# Patient Record
Sex: Female | Born: 1935 | Race: White | Hispanic: No | Marital: Married
Health system: Southern US, Community
[De-identification: ages and names within clinical notes are randomized; demographics above are authoritative.]

## PROBLEM LIST (undated history)

## (undated) DIAGNOSIS — M810 Age-related osteoporosis without current pathological fracture: Secondary | ICD-10-CM

## (undated) DIAGNOSIS — F329 Major depressive disorder, single episode, unspecified: Secondary | ICD-10-CM

## (undated) DIAGNOSIS — F32A Depression, unspecified: Secondary | ICD-10-CM

## (undated) DIAGNOSIS — H409 Unspecified glaucoma: Secondary | ICD-10-CM

## (undated) HISTORY — PX: CHOLECYSTECTOMY: SHX55

## (undated) HISTORY — PX: OTHER SURGICAL HISTORY: SHX169

## (undated) HISTORY — DX: Unspecified glaucoma: H40.9

## (undated) HISTORY — DX: Depression, unspecified: F32.A

## (undated) HISTORY — DX: Age-related osteoporosis without current pathological fracture: M81.0

---
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
## 1998-08-30 ENCOUNTER — Other Ambulatory Visit: Admission: RE | Admit: 1998-08-30 | Discharge: 1998-08-30 | Payer: Self-pay | Admitting: Urology

## 2001-09-18 ENCOUNTER — Other Ambulatory Visit: Admission: RE | Admit: 2001-09-18 | Discharge: 2001-09-18 | Payer: Self-pay | Admitting: Nurse Practitioner

## 2001-12-03 ENCOUNTER — Ambulatory Visit (HOSPITAL_COMMUNITY): Admission: RE | Admit: 2001-12-03 | Discharge: 2001-12-03 | Payer: Self-pay | Admitting: Gastroenterology

## 2002-07-31 ENCOUNTER — Encounter: Admission: RE | Admit: 2002-07-31 | Discharge: 2002-07-31 | Payer: Self-pay | Admitting: Family Medicine

## 2002-07-31 ENCOUNTER — Encounter: Payer: Self-pay | Admitting: Family Medicine

## 2002-08-18 ENCOUNTER — Encounter: Admission: RE | Admit: 2002-08-18 | Discharge: 2002-10-01 | Payer: Self-pay | Admitting: *Deleted

## 2017-06-25 DIAGNOSIS — C50919 Malignant neoplasm of unspecified site of unspecified female breast: Secondary | ICD-10-CM

## 2017-06-25 HISTORY — DX: Malignant neoplasm of unspecified site of unspecified female breast: C50.919

## 2018-06-06 HISTORY — PX: BREAST BIOPSY: SHX20

## 2018-06-25 DIAGNOSIS — Z923 Personal history of irradiation: Secondary | ICD-10-CM

## 2018-06-25 HISTORY — DX: Personal history of irradiation: Z92.3

## 2018-06-25 HISTORY — PX: BREAST LUMPECTOMY: SHX2

## 2019-12-17 ENCOUNTER — Other Ambulatory Visit
Admission: RE | Admit: 2019-12-17 | Discharge: 2019-12-17 | Disposition: A | Discharge status: Home / Self Care | Payer: Medicare Other | Source: Ambulatory Visit | Attending: Student | Admitting: Student

## 2019-12-17 DIAGNOSIS — R0602 Shortness of breath: Secondary | ICD-10-CM | POA: Diagnosis present

## 2019-12-17 DIAGNOSIS — R5383 Other fatigue: Secondary | ICD-10-CM | POA: Insufficient documentation

## 2019-12-17 DIAGNOSIS — Z7689 Persons encountering health services in other specified circumstances: Secondary | ICD-10-CM | POA: Insufficient documentation

## 2019-12-17 LAB — BRAIN NATRIURETIC PEPTIDE: B Natriuretic Peptide: 65 pg/mL (ref 0.0–100.0)

## 2019-12-17 LAB — FIBRIN DERIVATIVES D-DIMER (ARMC ONLY): Fibrin derivatives D-dimer (ARMC): 662 ng/mL (FEU) — ABNORMAL HIGH (ref 0.00–499.00)

## 2019-12-23 ENCOUNTER — Other Ambulatory Visit: Payer: Self-pay | Admitting: Ophthalmology

## 2019-12-23 DIAGNOSIS — H4921 Sixth [abducent] nerve palsy, right eye: Secondary | ICD-10-CM

## 2020-01-07 ENCOUNTER — Encounter: Payer: Self-pay | Admitting: Oncology

## 2020-01-07 DIAGNOSIS — F329 Major depressive disorder, single episode, unspecified: Secondary | ICD-10-CM | POA: Insufficient documentation

## 2020-01-07 DIAGNOSIS — F32A Depression, unspecified: Secondary | ICD-10-CM | POA: Insufficient documentation

## 2020-01-07 NOTE — Progress Notes (Signed)
Pt contacted for new patient visit. Pt recently moved from [facility], [place] and is wanting to establish cre with a local heme/onc. Reports she had breast cancer in the past and was treated with radiation. She has tried 2 anti-estrogen medications but says that they made her feel worse. Stopped most recent medication about 1 month ago. Did not specify medication names.

## 2020-01-08 ENCOUNTER — Other Ambulatory Visit: Payer: Self-pay

## 2020-01-08 ENCOUNTER — Inpatient Hospital Stay: Payer: Medicare Other

## 2020-01-08 ENCOUNTER — Telehealth: Payer: Self-pay | Admitting: *Deleted

## 2020-01-08 ENCOUNTER — Inpatient Hospital Stay: Payer: Medicare Other | Attending: Oncology | Admitting: Oncology

## 2020-01-08 ENCOUNTER — Encounter: Payer: Self-pay | Admitting: Oncology

## 2020-01-08 VITALS — BP 153/81 | HR 69 | Temp 97.7°F | Wt 179.9 lb

## 2020-01-08 DIAGNOSIS — Z79811 Long term (current) use of aromatase inhibitors: Secondary | ICD-10-CM | POA: Diagnosis not present

## 2020-01-08 DIAGNOSIS — Z170 Estrogen receptor positive status [ER+]: Secondary | ICD-10-CM | POA: Diagnosis not present

## 2020-01-08 DIAGNOSIS — C50512 Malignant neoplasm of lower-outer quadrant of left female breast: Secondary | ICD-10-CM | POA: Diagnosis not present

## 2020-01-08 DIAGNOSIS — Z853 Personal history of malignant neoplasm of breast: Secondary | ICD-10-CM

## 2020-01-08 DIAGNOSIS — N63 Unspecified lump in unspecified breast: Secondary | ICD-10-CM

## 2020-01-08 NOTE — Progress Notes (Signed)
Hematology/Oncology Consult note [facility] Telephone:([phone] [phone]   Patient Care Team: Gladstone Lighter, MD as PCP - General (Internal Medicine)  REFERRING PROVIDER: Gladstone Lighter, MD  CHIEF COMPLAINTS/REASON FOR VISIT:  Evaluation of breast cancer  HISTORY OF PRESENTING ILLNESS:   Kimberly Hatfield is a  84 y.o.  female with PMH listed below was seen in consultation at the request of  Gladstone Lighter, MD  for evaluation of history of breast cancer.  Patient has a history of left breast cancer.  Her previous cancer care was at [place]. Patient moved to [facility] and wants to establish care with local oncologist. Patient brought past medical records and extensive medical record review was performed by me.  05/08/2018, screening mammogram showed right breast negative, left breast small 6 mm mass 6:00, additional 7 or 8 mm mass at 9:00.  Biopsy showed 5:00 breast mass was positive for invasive mammary carcinoma, ER/PR positive HER-2 negative.  9:00 breast mass biopsy showed intraductal papilloma. 06/27/2018 left partial mastectomy and sentinel lymph node biopsy.  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  Sentinel lymph nodes were negative. Received radiation to the left breast with a dose of 42.56 Gy in 16 fractions followed by boost to the left lumpectomy cavity for an additional 10 Gray in 4 fractions.  Total dose was 52.56 Gy in 20 fractions. 10/10/2018 started on Arimidex. 10/31/2018, Arimidex was held due to severe hot flashes. 11/24/2018, switched to Aromasin.  Patient reports that she has stopped Aromasin treatments about 1 month ago.  She reports feeling that both Arimidex and Aromasin have caused severe fatigue, hot  flash.  Patient has been on Effexor chronically which did not help her vasomotor symptoms. She wants to discuss if there is any other options.  Patient was accompanied by her husband.   Review of Systems  Constitutional: Negative for appetite change, chills, fatigue and fever.  HENT:   Negative for hearing loss and voice change.   Eyes: Negative for eye problems.  Respiratory: Negative for chest tightness and cough.   Cardiovascular: Negative for chest pain.  Gastrointestinal: Negative for abdominal distention, abdominal pain and blood in stool.  Endocrine: Negative for hot flashes.  Genitourinary: Negative for difficulty urinating and frequency.   Musculoskeletal: Negative for arthralgias.  Skin: Negative for itching and rash.  Neurological: Negative for extremity weakness.  Hematological: Negative for adenopathy.  Psychiatric/Behavioral: Negative for confusion.    MEDICAL HISTORY:  Past Medical History:  Diagnosis Date  . Breast cancer ([place]) 2019  . Depression   . Glaucoma   . Osteoporosis     SURGICAL HISTORY: Past Surgical History:  Procedure Laterality Date  . CHOLECYSTECTOMY    . left breast lumpectomy      SOCIAL HISTORY: Social History   Socioeconomic History  . Marital status: Married    Spouse name: Not on file  . Number of children: Not on file  . Years of education: Not on file  . Highest education level: Not on file  Occupational History  . Not on file  Tobacco Use  . Smoking status: Never Smoker  Vaping Use  . Vaping Use: Never used  Substance and Sexual Activity  . Alcohol use: Not Currently    Comment: occasional glass of wine   . Drug use: Never  . Sexual activity:  Not on file  Other Topics Concern  . Not on file  Social History Narrative  . Not on file   Social Determinants of Health   Financial Resource Strain:   . Difficulty of Paying Living Expenses:   Food Insecurity:   . Worried About Charity fundraiser in the Last Year:   .  Arboriculturist in the Last Year:   Transportation Needs:   . Film/video editor (Medical):   Marland Kitchen Lack of Transportation (Non-Medical):   Physical Activity:   . Days of Exercise per Week:   . Minutes of Exercise per Session:   Stress:   . Feeling of Stress :   Social Connections:   . Frequency of Communication with Friends and Family:   . Frequency of Social Gatherings with Friends and Family:   . Attends Religious Services:   . Active Member of Clubs or Organizations:   . Attends Archivist Meetings:   Marland Kitchen Marital Status:   Intimate Partner Violence:   . Fear of Current or Ex-Partner:   . Emotionally Abused:   Marland Kitchen Physically Abused:   . Sexually Abused:     FAMILY HISTORY: Family History  Problem Relation Age of Onset  . Heart disease Mother   . CAD Mother   . Heart disease Father   . CAD Father     ALLERGIES:  has no allergies on file.  MEDICATIONS:  Current Outpatient Medications  Medication Sig Dispense Refill  . brimonidine-timolol (COMBIGAN) 0.2-0.5 % ophthalmic solution 1 drop 2 (two) times daily    . esomeprazole (NEXIUM) 20 MG capsule Take by mouth.    . Travoprost, BAK Free, (TRAVATAN) 0.004 % SOLN ophthalmic solution 1 drop nightly    . traZODone (DESYREL) 100 MG tablet Take by mouth.    . venlafaxine XR (EFFEXOR-XR) 150 MG 24 hr capsule Take by mouth.     No current facility-administered medications for this visit.     PHYSICAL EXAMINATION: ECOG PERFORMANCE STATUS: 0 - Asymptomatic Vitals:   01/08/20 1500  BP: (!) 153/81  Pulse: 69  Temp: 97.7 F (36.5 C)   Filed Weights   01/08/20 1500  Weight: 179 lb 14.4 oz (81.6 kg)    Physical Exam Constitutional:      General: She is not in acute distress. HENT:     Head: Normocephalic and atraumatic.  Eyes:     General: No scleral icterus. Cardiovascular:     Rate and Rhythm: Normal rate and regular rhythm.     Heart sounds: Normal heart sounds.  Pulmonary:     Effort: Pulmonary effort  is normal. No respiratory distress.     Breath sounds: No wheezing.  Abdominal:     General: Bowel sounds are normal. There is no distension.     Palpations: Abdomen is soft.  Musculoskeletal:        General: No deformity. Normal range of motion.     Cervical back: Normal range of motion and neck supple.  Skin:    General: Skin is warm and dry.     Findings: No erythema or rash.  Neurological:     Mental Status: She is alert and oriented to person, place, and time. Mental status is at baseline.     Cranial Nerves: No cranial nerve deficit.     Coordination: Coordination normal.  Psychiatric:        Mood and Affect: Mood normal.   Breast exam was performed in seated and lying down  position. Patient is status post left lumpectomy with a well-healed surgical scar.  Palpable mass at the site of lumpectomy, no palpable mass in the right breast.  No palpable lymphadenopathy bilateral axilla    LABORATORY DATA:  I have reviewed the data as listed No results found for: WBC, HGB, HCT, MCV, PLT No results for input(s): NA, K, CL, CO2, GLUCOSE, BUN, CREATININE, CALCIUM, GFRNONAA, GFRAA, PROT, ALBUMIN, AST, ALT, ALKPHOS, BILITOT, BILIDIR, IBILI in the last 8760 hours. Iron/TIBC/Ferritin/ %Sat No results found for: IRON, TIBC, FERRITIN, IRONPCTSAT    RADIOGRAPHIC STUDIES: I have personally reviewed the radiological images as listed and agreed with the findings in the report. No results found.    ASSESSMENT & PLAN:  1. Breast mass   2. History of breast cancer    #History of left stage I breast cancer,pT1c pN0, ER/PR positive, HER-2 negative.  Lumpectomy in 2020 January, status post adjuvant radiation. Patient has tried 2 different aromatase inhibitors and was not able to tolerate due to severe fatigue and vasomotor symptoms. 5-year disease free survival rates are about 98-100%, discussed about rationale of aromatase inhibitor, tamoxifen. Potential side effects of aromatase inhibitor  and tamoxifen will discussed with patient. Patient is not enthusiastic about restarting adjuvant endocrine therapy due to her age, poor tolerability.  Palpable left breast mass at the lumpectomy site, possible scarring tissue.  Patient was not able to tell whether this mass is new or chronic.  She did not previously be attention to that. I will obtain a diagnostic left mammogram for further evaluation.  Orders Placed This Encounter  Procedures  . MM DIAG BREAST TOMO UNI LEFT    Standing Status:   Future    Standing Expiration Date:   01/07/2021    Order Specific Question:   Reason for Exam (SYMPTOM  OR DIAGNOSIS REQUIRED)    Answer:   palpable left breast mass with history of left breast cancer    Order Specific Question:   Preferred imaging location?    Answer:   [facility]  . US Breast Limited Uni Left Inc Axilla    Standing Status:   Future    Standing Expiration Date:   01/07/2021    Order Specific Question:   Reason for Exam (SYMPTOM  OR DIAGNOSIS REQUIRED)    Answer:   palpable left breast mass with history of left breast cancer    Order Specific Question:   Preferred imaging location?    Answer:   [facility]    All questions were answered. The patient knows to call the clinic with any problems questions or concerns.  cc Gladstone Lighter, MD    Return of visit: 6 months Thank you for this kind referral and the opportunity to participate in the care of this patient. A copy of today's note is routed to referring provider    Earlie Server, MD, PhD Hematology Oncology [facility] Pager- [phone] 01/08/2020

## 2020-01-08 NOTE — Telephone Encounter (Signed)
Per 01/08/20 los. Mammogram (last one at another facility)6 months lab/MD   Pt was made aware that she would have to go to [facility] to sign a release form before her mammogram could be scheduled.  All other appts per 01/08/20 los were scheduled as requested.

## 2020-01-22 ENCOUNTER — Other Ambulatory Visit: Payer: Self-pay | Admitting: Student

## 2020-01-22 DIAGNOSIS — R7989 Other specified abnormal findings of blood chemistry: Secondary | ICD-10-CM

## 2020-01-26 ENCOUNTER — Ambulatory Visit
Admission: RE | Admit: 2020-01-26 | Discharge: 2020-01-26 | Disposition: A | Discharge status: Home / Self Care | Payer: Medicare Other | Source: Ambulatory Visit | Attending: Student | Admitting: Student

## 2020-01-26 ENCOUNTER — Other Ambulatory Visit: Payer: Self-pay

## 2020-01-26 DIAGNOSIS — R7989 Other specified abnormal findings of blood chemistry: Secondary | ICD-10-CM

## 2020-01-26 LAB — POCT I-STAT CREATININE: Creatinine, Ser: 0.9 mg/dL (ref 0.44–1.00)

## 2020-01-26 MED ORDER — IOHEXOL 350 MG/ML SOLN
75.0000 mL | Freq: Once | INTRAVENOUS | Status: AC | PRN
  Administered 2020-01-26: 75 mL via INTRAVENOUS

## 2020-01-27 ENCOUNTER — Other Ambulatory Visit: Payer: Self-pay

## 2020-01-28 ENCOUNTER — Ambulatory Visit
Admission: RE | Admit: 2020-01-28 | Discharge: 2020-01-28 | Disposition: A | Discharge status: Home / Self Care | Payer: Medicare Other | Source: Ambulatory Visit | Attending: Ophthalmology | Admitting: Ophthalmology

## 2020-01-28 DIAGNOSIS — H4921 Sixth [abducent] nerve palsy, right eye: Secondary | ICD-10-CM

## 2020-01-28 MED ORDER — GADOBENATE DIMEGLUMINE 529 MG/ML IV SOLN
17.0000 mL | Freq: Once | INTRAVENOUS | Status: AC | PRN
  Administered 2020-01-28: 17 mL via INTRAVENOUS

## 2020-02-08 ENCOUNTER — Other Ambulatory Visit: Payer: Self-pay

## 2020-02-08 ENCOUNTER — Ambulatory Visit
Admission: RE | Admit: 2020-02-08 | Discharge: 2020-02-08 | Disposition: A | Discharge status: Home / Self Care | Payer: Medicare Other | Source: Ambulatory Visit | Attending: Oncology | Admitting: Oncology

## 2020-02-08 DIAGNOSIS — N6320 Unspecified lump in the left breast, unspecified quadrant: Secondary | ICD-10-CM | POA: Diagnosis not present

## 2020-02-08 DIAGNOSIS — N63 Unspecified lump in unspecified breast: Secondary | ICD-10-CM | POA: Diagnosis present

## 2020-02-08 DIAGNOSIS — Z853 Personal history of malignant neoplasm of breast: Secondary | ICD-10-CM | POA: Insufficient documentation

## 2020-02-09 ENCOUNTER — Telehealth: Payer: Self-pay

## 2020-02-09 NOTE — Telephone Encounter (Signed)
-----   Message from Earlie Server, MD sent at 02/09/2020  9:08 AM EDT ----- Mammogram result is good. Please let her know thanks.

## 2020-02-09 NOTE — Telephone Encounter (Signed)
Patient informed. 

## 2020-04-26 ENCOUNTER — Telehealth: Payer: Self-pay | Admitting: *Deleted

## 2020-04-26 DIAGNOSIS — Z853 Personal history of malignant neoplasm of breast: Secondary | ICD-10-CM

## 2020-04-26 NOTE — Telephone Encounter (Signed)
Orders entered.  Kimberly Hatfield, please schedule and inform patient of appt detials.

## 2020-04-26 NOTE — Telephone Encounter (Signed)
Patient is due for her routine yearly mammogram this month and it is not ordered. She had a mammogram of left breast in August, but now needs full mammogram

## 2020-04-26 NOTE — Telephone Encounter (Signed)
Team, please obtain Bilateral diagnostic mammogram in November of 2021

## 2020-04-27 NOTE — Telephone Encounter (Signed)
Done.. Pt mammogram has been scheduled as requested. Pt is aware of the sched 11/19  10:20 @ [place] NEW appt reminder letter will be mailed out also

## 2020-05-13 ENCOUNTER — Ambulatory Visit
Admission: RE | Admit: 2020-05-13 | Discharge: 2020-05-13 | Disposition: A | Discharge status: Home / Self Care | Payer: Medicare Other | Source: Ambulatory Visit | Attending: Oncology | Admitting: Oncology

## 2020-05-13 ENCOUNTER — Other Ambulatory Visit: Payer: Self-pay

## 2020-05-13 DIAGNOSIS — Z853 Personal history of malignant neoplasm of breast: Secondary | ICD-10-CM

## 2020-07-08 ENCOUNTER — Telehealth: Payer: Self-pay | Admitting: Oncology

## 2020-07-08 NOTE — Telephone Encounter (Signed)
Pt aware.

## 2020-07-08 NOTE — Telephone Encounter (Signed)
07/08/2020 Left VM informing pt appts have been moved out per Dr. Tasia Catchings due to inclement weather srw

## 2020-07-11 ENCOUNTER — Inpatient Hospital Stay: Payer: Medicare Other

## 2020-07-11 ENCOUNTER — Inpatient Hospital Stay: Payer: Medicare Other | Admitting: Oncology

## 2020-07-26 ENCOUNTER — Telehealth: Payer: Self-pay | Admitting: Oncology

## 2020-07-26 NOTE — Telephone Encounter (Signed)
Pt called to reschedule her appts scheduled on 2/3.  Her husband is critically ill and she does not want to leave his side.  Rescheduled for two weeks out.

## 2020-07-28 ENCOUNTER — Inpatient Hospital Stay: Payer: Medicare Other

## 2020-07-28 ENCOUNTER — Inpatient Hospital Stay: Payer: Medicare Other | Admitting: Oncology

## 2020-08-09 ENCOUNTER — Other Ambulatory Visit: Payer: Self-pay

## 2020-08-09 DIAGNOSIS — Z853 Personal history of malignant neoplasm of breast: Secondary | ICD-10-CM

## 2020-08-10 ENCOUNTER — Encounter: Payer: Self-pay | Admitting: Oncology

## 2020-08-10 ENCOUNTER — Inpatient Hospital Stay: Payer: Medicare Other | Attending: Oncology

## 2020-08-10 ENCOUNTER — Inpatient Hospital Stay (HOSPITAL_BASED_OUTPATIENT_CLINIC_OR_DEPARTMENT_OTHER): Payer: Medicare Other | Admitting: Oncology

## 2020-08-10 VITALS — BP 134/84 | HR 66 | Temp 98.1°F | Wt 178.3 lb

## 2020-08-10 DIAGNOSIS — M810 Age-related osteoporosis without current pathological fracture: Secondary | ICD-10-CM | POA: Insufficient documentation

## 2020-08-10 DIAGNOSIS — Z170 Estrogen receptor positive status [ER+]: Secondary | ICD-10-CM | POA: Diagnosis not present

## 2020-08-10 DIAGNOSIS — R232 Flushing: Secondary | ICD-10-CM | POA: Insufficient documentation

## 2020-08-10 DIAGNOSIS — Z853 Personal history of malignant neoplasm of breast: Secondary | ICD-10-CM

## 2020-08-10 DIAGNOSIS — Z9889 Other specified postprocedural states: Secondary | ICD-10-CM | POA: Diagnosis not present

## 2020-08-10 DIAGNOSIS — Z923 Personal history of irradiation: Secondary | ICD-10-CM | POA: Diagnosis not present

## 2020-08-10 DIAGNOSIS — Z79811 Long term (current) use of aromatase inhibitors: Secondary | ICD-10-CM | POA: Diagnosis not present

## 2020-08-10 DIAGNOSIS — Z79899 Other long term (current) drug therapy: Secondary | ICD-10-CM | POA: Diagnosis not present

## 2020-08-10 DIAGNOSIS — F32A Depression, unspecified: Secondary | ICD-10-CM | POA: Diagnosis not present

## 2020-08-10 DIAGNOSIS — Z634 Disappearance and death of family member: Secondary | ICD-10-CM | POA: Insufficient documentation

## 2020-08-10 DIAGNOSIS — F4321 Adjustment disorder with depressed mood: Secondary | ICD-10-CM | POA: Diagnosis not present

## 2020-08-10 DIAGNOSIS — R5383 Other fatigue: Secondary | ICD-10-CM | POA: Diagnosis not present

## 2020-08-10 DIAGNOSIS — C50812 Malignant neoplasm of overlapping sites of left female breast: Secondary | ICD-10-CM | POA: Diagnosis present

## 2020-08-10 LAB — CBC WITH DIFFERENTIAL/PLATELET
Abs Immature Granulocytes: 0.01 10*3/uL (ref 0.00–0.07)
Basophils Absolute: 0 10*3/uL (ref 0.0–0.1)
Basophils Relative: 1 %
Eosinophils Absolute: 0.3 10*3/uL (ref 0.0–0.5)
Eosinophils Relative: 5 %
HCT: 37.1 % (ref 36.0–46.0)
Hemoglobin: 12.7 g/dL (ref 12.0–15.0)
Immature Granulocytes: 0 %
Lymphocytes Relative: 37 %
Lymphs Abs: 2.3 10*3/uL (ref 0.7–4.0)
MCH: 31.7 pg (ref 26.0–34.0)
MCHC: 34.2 g/dL (ref 30.0–36.0)
MCV: 92.5 fL (ref 80.0–100.0)
Monocytes Absolute: 0.6 10*3/uL (ref 0.1–1.0)
Monocytes Relative: 10 %
Neutro Abs: 2.9 10*3/uL (ref 1.7–7.7)
Neutrophils Relative %: 47 %
Platelets: 320 10*3/uL (ref 150–400)
RBC: 4.01 MIL/uL (ref 3.87–5.11)
RDW: 13.5 % (ref 11.5–15.5)
WBC: 6.1 10*3/uL (ref 4.0–10.5)
nRBC: 0 % (ref 0.0–0.2)

## 2020-08-10 LAB — COMPREHENSIVE METABOLIC PANEL
ALT: 21 U/L (ref 0–44)
AST: 24 U/L (ref 15–41)
Albumin: 3.7 g/dL (ref 3.5–5.0)
Alkaline Phosphatase: 55 U/L (ref 38–126)
Anion gap: 9 (ref 5–15)
BUN: 17 mg/dL (ref 8–23)
CO2: 27 mmol/L (ref 22–32)
Calcium: 9 mg/dL (ref 8.9–10.3)
Chloride: 100 mmol/L (ref 98–111)
Creatinine, Ser: 0.77 mg/dL (ref 0.44–1.00)
GFR, Estimated: >60 mL/min (ref >60)
Glucose, Bld: 110 mg/dL — ABNORMAL HIGH (ref 70–99)
Potassium: 4.5 mmol/L (ref 3.5–5.1)
Sodium: 136 mmol/L (ref 135–145)
Total Bilirubin: 0.6 mg/dL (ref 0.3–1.2)
Total Protein: 7.4 g/dL (ref 6.5–8.1)

## 2020-08-10 NOTE — Progress Notes (Signed)
Patient here for oncology follow-up appointment, expresses  concerns of lump on left side

## 2020-08-10 NOTE — Progress Notes (Signed)
Hematology/Oncology Consult note [facility] [phone] [phone]   Patient Care Team: Gladstone Lighter, MD as PCP - General (Internal Medicine)  REFERRING PROVIDER: Gladstone Lighter, MD  CHIEF COMPLAINTS/REASON FOR VISIT:  Evaluation of breast cancer  HISTORY OF PRESENTING ILLNESS:   Kimberly Hatfield is a  85 y.o.  female with PMH listed below was seen in consultation at the request of  Gladstone Lighter, MD  for evaluation of history of breast cancer.  Patient has a history of left breast cancer.  Her previous cancer care was at [place]. Patient moved to [facility] and wants to establish care with local oncologist. Patient brought past medical records and extensive medical record review was performed by me.  05/08/2018, screening mammogram showed right breast negative, left breast small 6 mm mass 6:00, additional 7 or 8 mm mass at 9:00.  Biopsy showed 5:00 breast mass was positive for invasive mammary carcinoma, ER/PR positive HER-2 negative.  9:00 breast mass biopsy showed intraductal papilloma. 06/27/2018 left partial mastectomy and sentinel lymph node biopsy.  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  Sentinel lymph nodes were negative. Received radiation to the left breast with a dose of 42.56 Gy in 16 fractions followed by boost to the left lumpectomy cavity for an additional 10 Gray in 4 fractions.  Total dose was 52.56 Gy in 20 fractions. 10/10/2018 started on Arimidex. 10/31/2018, Arimidex was held due to severe hot flashes. 11/24/2018, switched to Aromasin.  Patient reports that she has stopped Aromasin treatments about 1 month ago.  She reports feeling that both Arimidex and Aromasin have caused severe fatigue, hot  flash.  Patient has been on Effexor chronically which did not help her vasomotor symptoms. She wants to discuss if there is any other options.  Patient was accompanied by her husband.  INTERVAL HISTORY Kimberly Hatfield is a 85 y.o. female who has above history reviewed by me today presents for follow up visit for history of breast cancer. Problems and complaints are listed below: Husband passed away recently.  Patient is grieving. She continues to worry about the left breast palpable mass around the previous lumpectomy scar. Patient has had diagnostic mammogram done after last visit which showed no evidence of recurrence.  Review of Systems  Constitutional: Negative for appetite change, chills, fatigue and fever.  HENT:   Negative for hearing loss and voice change.   Eyes: Negative for eye problems.  Respiratory: Negative for chest tightness and cough.   Cardiovascular: Negative for chest pain.  Gastrointestinal: Negative for abdominal distention, abdominal pain and blood in stool.  Endocrine: Negative for hot flashes.  Genitourinary: Negative for difficulty urinating and frequency.   Musculoskeletal: Negative for arthralgias.  Skin: Negative for itching and rash.  Neurological: Negative for extremity weakness.  Hematological: Negative for adenopathy.  Psychiatric/Behavioral: Negative for confusion.       She feels sad    MEDICAL HISTORY:  Past Medical History:  Diagnosis Date  . Breast cancer ([place]) 2019   left breast  . Depression   . Glaucoma   . Osteoporosis   . Personal history of radiation therapy 2020   left breast ca     SURGICAL HISTORY: Past Surgical History:  Procedure Laterality Date  . BREAST BIOPSY Left 06/06/2018   6:00 IMC  . BREAST BIOPSY Left 06/06/2018  9:00 intarductal papilloma  . BREAST LUMPECTOMY Left 06/2018   mucinous carcinoma grade 2 clear margins, negative LN  . CHOLECYSTECTOMY    . left breast lumpectomy      SOCIAL HISTORY: Social  History   Socioeconomic History  . Marital status: Married    Spouse name: Not on file  . Number of children: Not on file  . Years of education: Not on file  . Highest education level: Not on file  Occupational History  . Not on file  Tobacco Use  . Smoking status: Never Smoker  . Smokeless tobacco: Never Used  Vaping Use  . Vaping Use: Never used  Substance and Sexual Activity  . Alcohol use: Not Currently    Comment: occasional glass of wine   . Drug use: Never  . Sexual activity: Not on file  Other Topics Concern  . Not on file  Social History Narrative  . Not on file   Social Determinants of Health   Financial Resource Strain: Not on file  Food Insecurity: Not on file  Transportation Needs: Not on file  Physical Activity: Not on file  Stress: Not on file  Social Connections: Not on file  Intimate Partner Violence: Not on file    FAMILY HISTORY: Family History  Problem Relation Age of Onset  . Heart disease Mother   . CAD Mother   . Heart disease Father   . CAD Father     ALLERGIES:  has no allergies on file.  MEDICATIONS:  Current Outpatient Medications  Medication Sig Dispense Refill  . brimonidine-timolol (COMBIGAN) 0.2-0.5 % ophthalmic solution 1 drop 2 (two) times daily    . esomeprazole (NEXIUM) 20 MG capsule Take by mouth.    . traZODone (DESYREL) 100 MG tablet Take by mouth.    . venlafaxine XR (EFFEXOR-XR) 150 MG 24 hr capsule Take by mouth.    . Travoprost, BAK Free, (TRAVATAN) 0.004 % SOLN ophthalmic solution 1 drop nightly (Patient not taking: Reported on 08/10/2020)     No current facility-administered medications for this visit.     PHYSICAL EXAMINATION: ECOG PERFORMANCE STATUS: 0 - Asymptomatic Vitals:   08/10/20 1413  BP: 134/84  Pulse: 66  Temp: 98.1 F (36.7 C)  SpO2: 100%   Filed Weights   08/10/20 1413  Weight: 178 lb 5 oz (80.9 kg)    Physical Exam Constitutional:      General: She is not in acute distress. HENT:      Head: Normocephalic and atraumatic.  Eyes:     General: No scleral icterus. Cardiovascular:     Rate and Rhythm: Normal rate and regular rhythm.     Heart sounds: Normal heart sounds.  Pulmonary:     Effort: Pulmonary effort is normal. No respiratory distress.     Breath sounds: No wheezing.  Abdominal:     General: Bowel sounds are normal. There is no distension.     Palpations: Abdomen is soft.  Musculoskeletal:        General: No deformity. Normal range of motion.     Cervical back: Normal range of motion and neck supple.  Skin:    General: Skin is warm and dry.     Findings: No erythema or rash.  Neurological:     Mental Status: She is alert and oriented to person, place, and time. Mental status is at baseline.     Cranial Nerves: No cranial nerve deficit.     Coordination: Coordination normal.  Psychiatric:  Mood and Affect: Mood normal.   Breast exam was performed in seated and lying down position. Patient is status post left lumpectomy with a well-healed surgical scar.  Palpable mass at the site of lumpectomy, no palpable mass in the right breast.  No palpable lymphadenopathy bilateral axilla    LABORATORY DATA:  I have reviewed the data as listed Lab Results  Component Value Date   WBC 6.1 08/10/2020   HGB 12.7 08/10/2020   HCT 37.1 08/10/2020   MCV 92.5 08/10/2020   PLT 320 08/10/2020   Recent Labs    01/26/20 1342 08/10/20 1345  NA  --  136  K  --  4.5  CL  --  100  CO2  --  27  GLUCOSE  --  110*  BUN  --  17  CREATININE 0.90 0.77  CALCIUM  --  9.0  GFRNONAA  --  >60  PROT  --  7.4  ALBUMIN  --  3.7  AST  --  24  ALT  --  21  ALKPHOS  --  55  BILITOT  --  0.6   Iron/TIBC/Ferritin/ %Sat No results found for: IRON, TIBC, FERRITIN, IRONPCTSAT    RADIOGRAPHIC STUDIES: I have personally reviewed the radiological images as listed and agreed with the findings in the report. No results found.    ASSESSMENT & PLAN:  1. History of breast  cancer   2. Hx of lumpectomy   3. Grief     #History of left stage I breast cancer,pT1c pN0, ER/PR positive, HER-2 negative.  Lumpectomy in 2020 January, status post adjuvant radiation. Currently she is not on antiestrogen treatments due to poor tolerance to two different aromatase inhibitors in the past. 05/13/2020 bilateral left diagnostic mammogram showed no evidence of breast malignancy. Palpable lump at the lumpectomy site is likely scar tissue. I will send patient to establish care with Dr. Peyton Najjar.  Grief, emotional support was provided.  Orders Placed This Encounter  Procedures  . CBC with Differential/Platelet    Standing Status:   Future    Standing Expiration Date:   08/10/2021  . Comprehensive metabolic panel    Standing Status:   Future    Standing Expiration Date:   08/10/2021  . Ambulatory referral to General Surgery    Referral Priority:   Routine    Referral Type:   Surgical    Referral Reason:   Specialty Services Required    Referred to Provider:   Herbert Pun, MD    Requested Specialty:   General Surgery    Number of Visits Requested:   1    All questions were answered. The patient knows to call the clinic with any problems questions or concerns.  cc Gladstone Lighter, MD    Return of visit: 6 months   Earlie Server, MD, PhD Hematology Oncology [facility] Pager- [phone] 08/10/2020

## 2020-08-29 ENCOUNTER — Other Ambulatory Visit: Payer: Self-pay | Admitting: Specialist

## 2020-08-29 DIAGNOSIS — Z853 Personal history of malignant neoplasm of breast: Secondary | ICD-10-CM

## 2020-08-29 DIAGNOSIS — R911 Solitary pulmonary nodule: Secondary | ICD-10-CM

## 2020-09-13 ENCOUNTER — Other Ambulatory Visit: Payer: Self-pay

## 2020-09-13 ENCOUNTER — Ambulatory Visit
Admission: RE | Admit: 2020-09-13 | Discharge: 2020-09-13 | Disposition: A | Discharge status: Home / Self Care | Payer: Medicare Other | Source: Ambulatory Visit | Attending: Specialist | Admitting: Specialist

## 2020-09-13 DIAGNOSIS — R911 Solitary pulmonary nodule: Secondary | ICD-10-CM | POA: Diagnosis present

## 2020-09-13 DIAGNOSIS — Z853 Personal history of malignant neoplasm of breast: Secondary | ICD-10-CM | POA: Insufficient documentation

## 2021-01-22 IMAGING — MG MM DIGITAL DIAGNOSTIC UNILAT*L* W/ TOMO W/ CAD
6 of 10 series · 6 of 30 positions shown · non-contrast
Comparison: Previous exam(s).

CLINICAL DATA: 84-year-old female with history of left breast
lumpectomy in 4958. She presents for evaluation of a palpable lump
in the left breast.

EXAM:
DIGITAL DIAGNOSTIC UNILATERAL LEFT MAMMOGRAM WITH TOMO AND CAD;
ULTRASOUND LEFT BREAST LIMITED

[L CC synth-2D (1 of 2)]
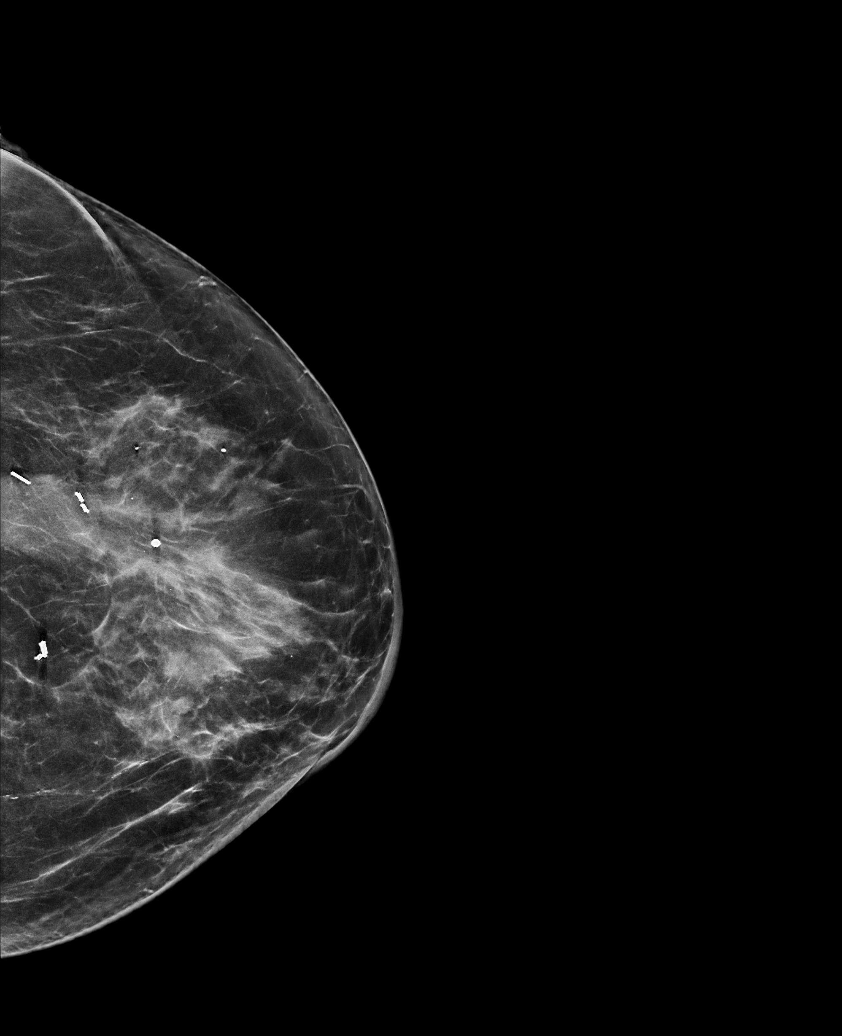

[L TAN synth-2D (1 of 2)]
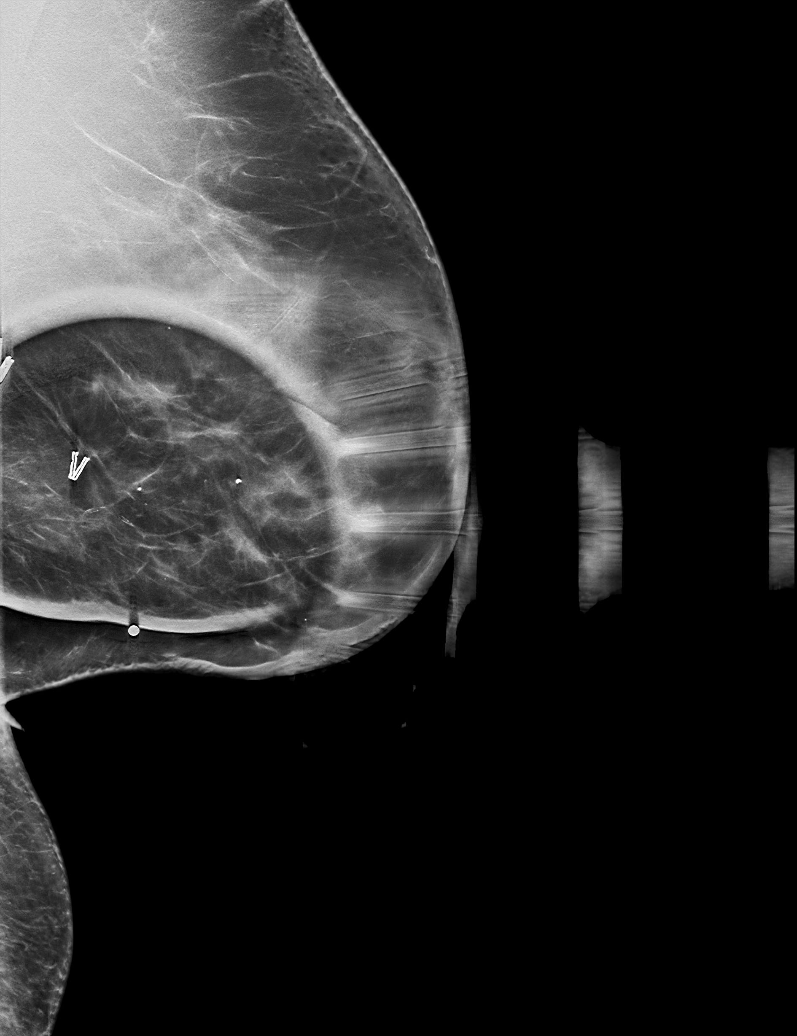

[L TAN synth-2D (2 of 2)]
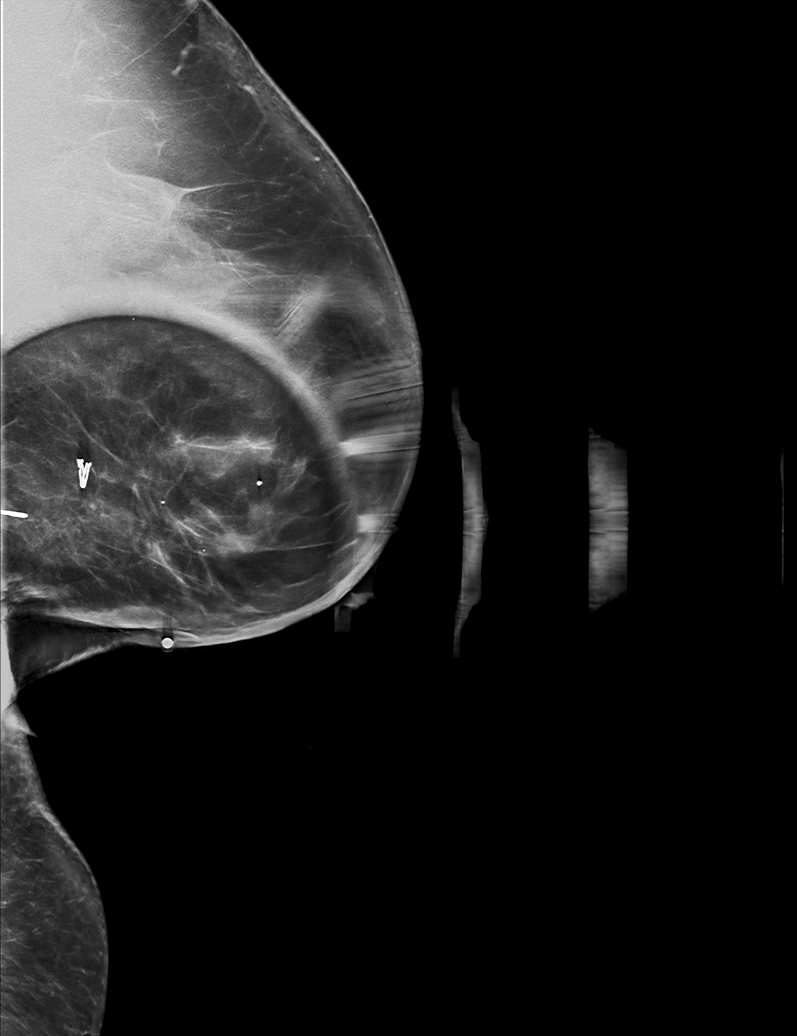

[L CC synth-2D (2 of 2)]
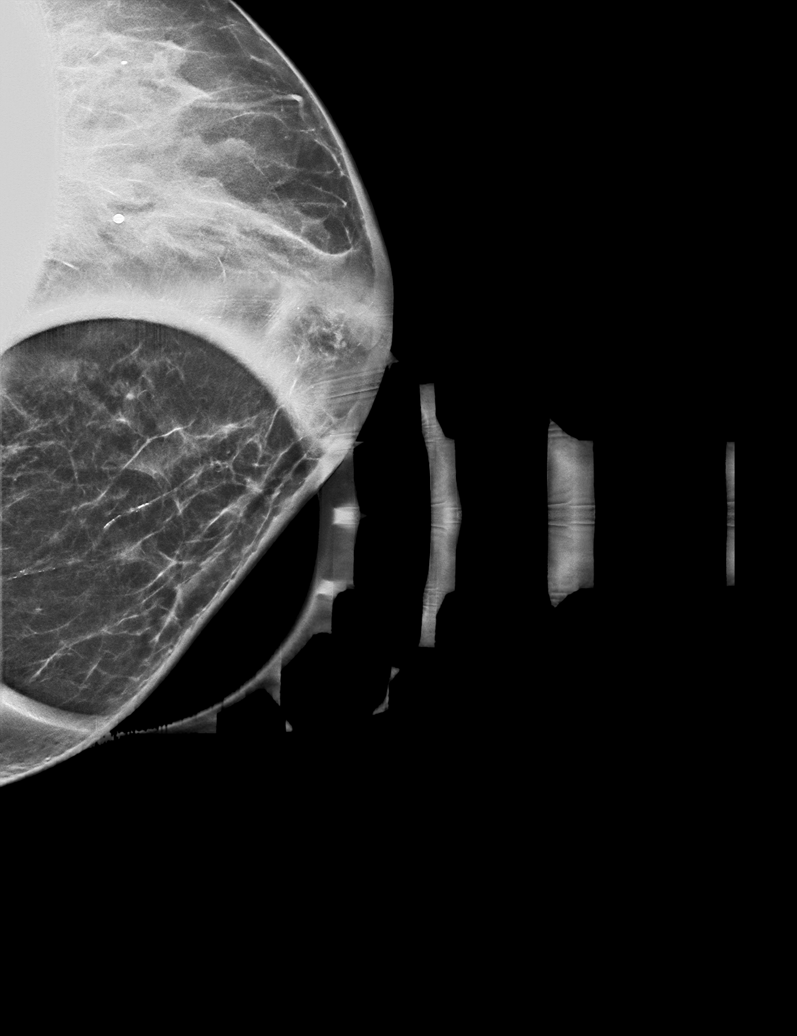

[L MLO synth-2D]
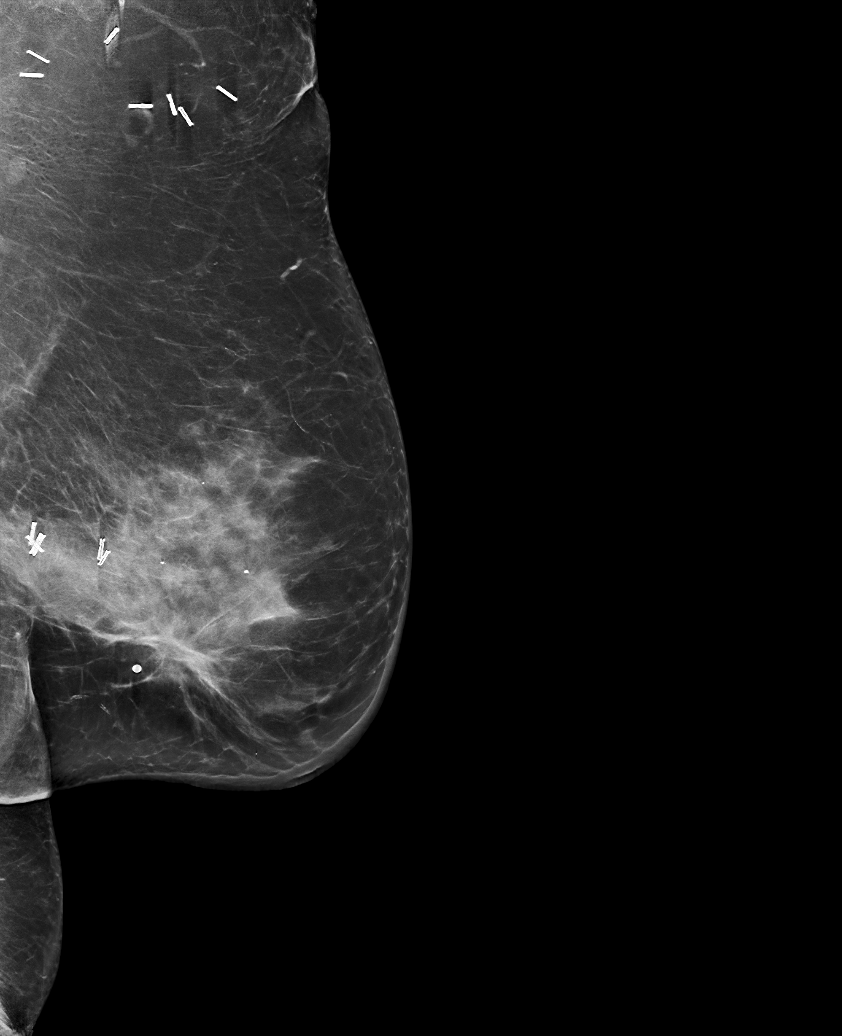

[L TAN tomo · tomo slice 38/75.0]
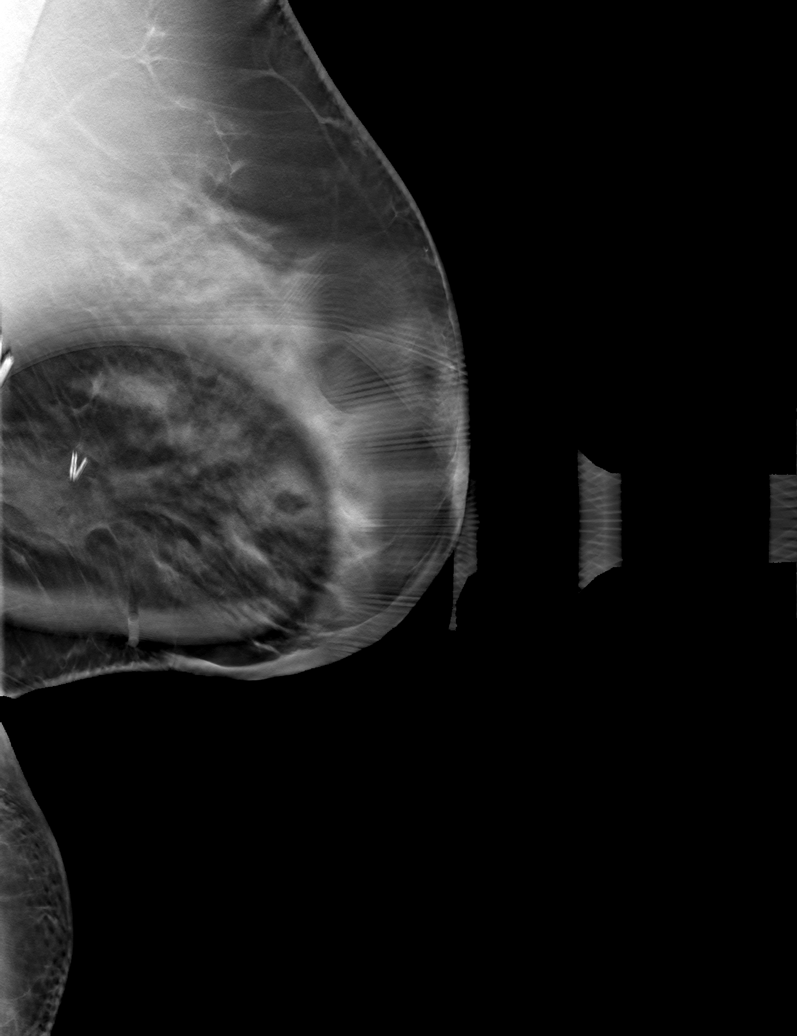

[6 of 30 positions shown; findings below may reference images not displayed]

ACR Breast Density Category c: The breast tissue is heterogeneously
dense, which may obscure small masses.
FINDINGS: A BB has been placed on the inferior aspect of the left breast at
the palpable site of concern. Deep to this marker there is a
surgical distortion just anterior to a broad seroma, which measures
close to 3 cm. The appearance does not appear significantly
different from the prior mammogram. The left breast lumpectomy site
is stable.

An asymmetry seen in the medial middle depth of the left breast on
the initial CC view resolves on spot compression tomosynthesis
imaging consistent with overlapping fibroglandular tissue.

No suspicious calcifications, masses or areas of distortion are seen
in the bilateral breasts.

Mammographic images were processed with CAD.

Physical exam of the inferior left breast demonstrates a superficial
oval mass.

Ultrasound targeted to the palpable site in the left breast at 5
o'clock, 4 cm from the nipple demonstrates a mildly complicated
fluid collection measuring 2.9 x 1.6 x 2.7 cm.

Ultrasound of the medial aspect of the left breast demonstrates
surgical scar and fat necrosis consistent with surgical change. No
suspicious masses are identified to correspond with the asymmetry
identified on the left CC mammographic image.
IMPRESSION: 1. The palpable site in the inferior left breast corresponds with
the patient's surgical seroma.

2.  No evidence of malignancy in the left breast.

RECOMMENDATION:
Bilateral diagnostic mammogram is recommended in Saturday April, 2020.

I have discussed the findings and recommendations with the patient.
If applicable, a reminder letter will be sent to the patient
regarding the next appointment.

BI-RADS CATEGORY  2: Benign.

## 2021-02-06 ENCOUNTER — Other Ambulatory Visit: Payer: Self-pay

## 2021-02-06 ENCOUNTER — Encounter: Payer: Self-pay | Admitting: Oncology

## 2021-02-06 ENCOUNTER — Inpatient Hospital Stay (HOSPITAL_BASED_OUTPATIENT_CLINIC_OR_DEPARTMENT_OTHER): Payer: Medicare Other | Admitting: Oncology

## 2021-02-06 ENCOUNTER — Inpatient Hospital Stay: Payer: Medicare Other | Attending: Oncology

## 2021-02-06 VITALS — BP 117/53 | HR 68 | Temp 96.7°F | Resp 18 | Wt 174.0 lb

## 2021-02-06 DIAGNOSIS — Z853 Personal history of malignant neoplasm of breast: Secondary | ICD-10-CM

## 2021-02-06 DIAGNOSIS — Z79811 Long term (current) use of aromatase inhibitors: Secondary | ICD-10-CM | POA: Diagnosis not present

## 2021-02-06 DIAGNOSIS — Z170 Estrogen receptor positive status [ER+]: Secondary | ICD-10-CM | POA: Diagnosis not present

## 2021-02-06 DIAGNOSIS — C50812 Malignant neoplasm of overlapping sites of left female breast: Secondary | ICD-10-CM | POA: Insufficient documentation

## 2021-02-06 DIAGNOSIS — Z1231 Encounter for screening mammogram for malignant neoplasm of breast: Secondary | ICD-10-CM | POA: Diagnosis not present

## 2021-02-06 LAB — CBC WITH DIFFERENTIAL/PLATELET
Abs Immature Granulocytes: 0.01 10*3/uL (ref 0.00–0.07)
Basophils Absolute: 0 10*3/uL (ref 0.0–0.1)
Basophils Relative: 0 %
Eosinophils Absolute: 0.2 10*3/uL (ref 0.0–0.5)
Eosinophils Relative: 4 %
HCT: 39.3 % (ref 36.0–46.0)
Hemoglobin: 13 g/dL (ref 12.0–15.0)
Immature Granulocytes: 0 %
Lymphocytes Relative: 35 %
Lymphs Abs: 1.9 10*3/uL (ref 0.7–4.0)
MCH: 31.4 pg (ref 26.0–34.0)
MCHC: 33.1 g/dL (ref 30.0–36.0)
MCV: 94.9 fL (ref 80.0–100.0)
Monocytes Absolute: 0.3 10*3/uL (ref 0.1–1.0)
Monocytes Relative: 6 %
Neutro Abs: 3 10*3/uL (ref 1.7–7.7)
Neutrophils Relative %: 55 %
Platelets: 318 10*3/uL (ref 150–400)
RBC: 4.14 MIL/uL (ref 3.87–5.11)
RDW: 13.1 % (ref 11.5–15.5)
WBC: 5.5 10*3/uL (ref 4.0–10.5)
nRBC: 0 % (ref 0.0–0.2)

## 2021-02-06 LAB — COMPREHENSIVE METABOLIC PANEL
ALT: 15 U/L (ref 0–44)
AST: 20 U/L (ref 15–41)
Albumin: 3.6 g/dL (ref 3.5–5.0)
Alkaline Phosphatase: 62 U/L (ref 38–126)
Anion gap: 5 (ref 5–15)
BUN: 13 mg/dL (ref 8–23)
CO2: 31 mmol/L (ref 22–32)
Calcium: 8.9 mg/dL (ref 8.9–10.3)
Chloride: 102 mmol/L (ref 98–111)
Creatinine, Ser: 0.75 mg/dL (ref 0.44–1.00)
GFR, Estimated: >60 mL/min (ref >60)
Glucose, Bld: 118 mg/dL — ABNORMAL HIGH (ref 70–99)
Potassium: 4.5 mmol/L (ref 3.5–5.1)
Sodium: 138 mmol/L (ref 135–145)
Total Bilirubin: 0.4 mg/dL (ref 0.3–1.2)
Total Protein: 7.3 g/dL (ref 6.5–8.1)

## 2021-02-06 NOTE — Progress Notes (Signed)
Patient here for oncology follow-up appointment, BP little low today, patient states she is occassionally lightheaded, no other concerns

## 2021-02-06 NOTE — Progress Notes (Signed)
Hematology/Oncology Consult note [facility] [phone] [phone]   Patient Care Team: Gladstone Lighter, MD as PCP - General (Internal Medicine)  REFERRING PROVIDER: Gladstone Lighter, MD  CHIEF COMPLAINTS/REASON FOR VISIT:  Follow up for breast cancer  HISTORY OF PRESENTING ILLNESS:   Kimberly Hatfield is a  85 y.o.  female with PMH listed below was seen in consultation at the request of  Gladstone Lighter, MD  for evaluation of history of breast cancer.  Patient has a history of left breast cancer.  Her previous cancer care was at [place]. Patient moved to [facility] and wants to establish care with local oncologist. Patient brought past medical records and extensive medical record review was performed by me.  05/08/2018, screening mammogram showed right breast negative, left breast small 6 mm mass 6:00, additional 7 or 8 mm mass at 9:00.  Biopsy showed 5:00 breast mass was positive for invasive mammary carcinoma, ER/PR positive HER-2 negative.  9:00 breast mass biopsy showed intraductal papilloma. 06/27/2018 left partial mastectomy and sentinel lymph node biopsy.  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  Sentinel lymph nodes were negative. Received radiation to the left breast with a dose of 42.56 Gy in 16 fractions followed by boost to the left lumpectomy cavity for an additional 10 Gray in 4 fractions.  Total dose was 52.56 Gy in 20 fractions. 10/10/2018 started on Arimidex. 10/31/2018, Arimidex was held due to severe hot flashes. 11/24/2018, switched to Aromasin.  She stopped Aromasin treatments about 1 month ago.  She reports feeling that both Arimidex and Aromasin have caused severe fatigue, hot flash.  Patient has been  on Effexor chronically which did not help her vasomotor symptoms. She is not interested in switching to Tamoxifen  08/18/2020 seen by Dr.Cintron for palpable knot along her surgery site which was felt ot be consistent with scar tissue clinically.   INTERVAL HISTORY Kimberly Hatfield is a 85 y.o. female who has above history reviewed by me today presents for follow up visit for history of breast cancer. She reports feeling well. Occasionally she feels lightheaded. BP is 117/53 today.   Review of Systems  Constitutional:  Negative for appetite change, chills, fatigue and fever.  HENT:   Negative for hearing loss and voice change.   Eyes:  Negative for eye problems.  Respiratory:  Negative for chest tightness and cough.   Cardiovascular:  Negative for chest pain.  Gastrointestinal:  Negative for abdominal distention, abdominal pain and blood in stool.  Endocrine: Negative for hot flashes.  Genitourinary:  Negative for difficulty urinating and frequency.   Musculoskeletal:  Negative for arthralgias.  Skin:  Negative for itching and rash.  Neurological:  Negative for extremity weakness.  Hematological:  Negative for adenopathy.  Psychiatric/Behavioral:  Negative for confusion.    MEDICAL HISTORY:  Past Medical History:  Diagnosis Date   Breast cancer ([place]) 2019   left breast   Depression    Glaucoma    Osteoporosis    Personal history of radiation therapy 2020   left breast ca     SURGICAL HISTORY: Past Surgical History:  Procedure Laterality Date   BREAST BIOPSY Left 06/06/2018   6:00 [facility]   BREAST BIOPSY Left 06/06/2018   9:00 intarductal papilloma   BREAST LUMPECTOMY Left 06/2018   mucinous carcinoma grade 2 clear margins,  negative LN   CHOLECYSTECTOMY     left breast lumpectomy      SOCIAL HISTORY: Social History   Socioeconomic History   Marital status: Widowed    Spouse name: Not on file   Number of children: Not on file   Years of education: Not on file    Highest education level: Not on file  Occupational History   Not on file  Tobacco Use   Smoking status: Never   Smokeless tobacco: Never  Vaping Use   Vaping Use: Never used  Substance and Sexual Activity   Alcohol use: Not Currently    Comment: occasional glass of wine    Drug use: Never   Sexual activity: Not on file  Other Topics Concern   Not on file  Social History Narrative   Not on file   Social Determinants of Health   Financial Resource Strain: Not on file  Food Insecurity: Not on file  Transportation Needs: Not on file  Physical Activity: Not on file  Stress: Not on file  Social Connections: Not on file  Intimate Partner Violence: Not on file    FAMILY HISTORY: Family History  Problem Relation Age of Onset   Heart disease Mother    CAD Mother    Heart disease Father    CAD Father     ALLERGIES:  has no allergies on file.  MEDICATIONS:  Current Outpatient Medications  Medication Sig Dispense Refill   brimonidine-timolol (COMBIGAN) 0.2-0.5 % ophthalmic solution 1 drop 2 (two) times daily     esomeprazole (NEXIUM) 20 MG capsule Take by mouth.     Travoprost, BAK Free, (TRAVATAN) 0.004 % SOLN ophthalmic solution      traZODone (DESYREL) 100 MG tablet Take by mouth.     venlafaxine XR (EFFEXOR-XR) 150 MG 24 hr capsule Take by mouth.     No current facility-administered medications for this visit.     PHYSICAL EXAMINATION: ECOG PERFORMANCE STATUS: 0 - Asymptomatic Vitals:   02/06/21 1001  BP: (!) 117/53  Pulse: 68  Resp: 18  Temp: (!) 96.7 F (35.9 C)  SpO2: 100%   Filed Weights   02/06/21 1001  Weight: 174 lb (78.9 kg)    Physical Exam Constitutional:      General: She is not in acute distress. HENT:     Head: Normocephalic and atraumatic.  Eyes:     General: No scleral icterus. Cardiovascular:     Rate and Rhythm: Normal rate and regular rhythm.     Heart sounds: Normal heart sounds.  Pulmonary:     Effort: Pulmonary effort is  normal. No respiratory distress.     Breath sounds: No wheezing.  Abdominal:     General: Bowel sounds are normal. There is no distension.     Palpations: Abdomen is soft.  Musculoskeletal:        General: No deformity. Normal range of motion.     Cervical back: Normal range of motion and neck supple.  Skin:    General: Skin is warm and dry.     Findings: No erythema or rash.  Neurological:     Mental Status: She is alert and oriented to person, place, and time. Mental status is at baseline.     Cranial Nerves: No cranial nerve deficit.     Coordination: Coordination normal.  Psychiatric:        Mood and Affect: Mood normal.      LABORATORY DATA:  I have reviewed the data as  listed Lab Results  Component Value Date   WBC 5.5 02/06/2021   HGB 13.0 02/06/2021   HCT 39.3 02/06/2021   MCV 94.9 02/06/2021   PLT 318 02/06/2021   Recent Labs    08/10/20 1345 02/06/21 0936  NA 136 138  K 4.5 4.5  CL 100 102  CO2 27 31  GLUCOSE 110* 118*  BUN 17 13  CREATININE 0.77 0.75  CALCIUM 9.0 8.9  GFRNONAA >60 >60  PROT 7.4 7.3  ALBUMIN 3.7 3.6  AST 24 20  ALT 21 15  ALKPHOS 55 62  BILITOT 0.6 0.4    Iron/TIBC/Ferritin/ %Sat No results found for: IRON, TIBC, FERRITIN, IRONPCTSAT    RADIOGRAPHIC STUDIES: I have personally reviewed the radiological images as listed and agreed with the findings in the report. No results found.     ASSESSMENT & PLAN:  1. History of breast cancer   2. Screening mammogram, encounter for     #History of left stage I breast cancer,pT1c pN0, ER/PR positive, HER-2 negative.  Lumpectomy in 2020 January, status post adjuvant radiation. Declined anti estrogen treatment due to poor tolerance to 2 different AI and she is not interested in trying Tamoxifen.  Will obtain annual bilateral mammogram    Orders Placed This Encounter  Procedures   MM 3D SCREEN BREAST BILATERAL    Standing Status:   Future    Standing Expiration Date:   02/06/2022     Order Specific Question:   Reason for Exam (SYMPTOM  OR DIAGNOSIS REQUIRED)    Answer:   history of breast cancer    Order Specific Question:   Preferred imaging location?    Answer:   [facility]   CBC with Differential/Platelet    Standing Status:   Future    Standing Expiration Date:   02/06/2022   Comprehensive metabolic panel    Standing Status:   Future    Standing Expiration Date:   02/06/2022    All questions were answered. The patient knows to call the clinic with any problems questions or concerns.  cc Gladstone Lighter, MD    Return of visit: 6 months   Earlie Server, MD, PhD Hematology Oncology [facility] Pager- [phone] 02/06/2021

## 2021-04-25 ENCOUNTER — Other Ambulatory Visit: Payer: Self-pay

## 2021-04-25 ENCOUNTER — Ambulatory Visit
Admission: RE | Admit: 2021-04-25 | Discharge: 2021-04-25 | Disposition: A | Discharge status: Home / Self Care | Payer: Medicare Other | Source: Ambulatory Visit | Attending: Oncology | Admitting: Oncology

## 2021-04-25 DIAGNOSIS — Z853 Personal history of malignant neoplasm of breast: Secondary | ICD-10-CM | POA: Diagnosis not present

## 2021-04-25 DIAGNOSIS — R921 Mammographic calcification found on diagnostic imaging of breast: Secondary | ICD-10-CM | POA: Insufficient documentation

## 2021-04-25 DIAGNOSIS — R928 Other abnormal and inconclusive findings on diagnostic imaging of breast: Secondary | ICD-10-CM | POA: Diagnosis not present

## 2021-04-25 DIAGNOSIS — Z1231 Encounter for screening mammogram for malignant neoplasm of breast: Secondary | ICD-10-CM | POA: Insufficient documentation

## 2021-04-26 ENCOUNTER — Telehealth: Payer: Self-pay

## 2021-04-26 ENCOUNTER — Other Ambulatory Visit: Payer: Self-pay

## 2021-04-26 ENCOUNTER — Other Ambulatory Visit: Payer: Self-pay | Admitting: Oncology

## 2021-04-26 DIAGNOSIS — Z853 Personal history of malignant neoplasm of breast: Secondary | ICD-10-CM

## 2021-04-26 DIAGNOSIS — R928 Other abnormal and inconclusive findings on diagnostic imaging of breast: Secondary | ICD-10-CM

## 2021-04-26 DIAGNOSIS — N6489 Other specified disorders of breast: Secondary | ICD-10-CM

## 2021-04-26 DIAGNOSIS — R921 Mammographic calcification found on diagnostic imaging of breast: Secondary | ICD-10-CM

## 2021-04-26 NOTE — Telephone Encounter (Signed)
[place], could you please schedule patient for diagnostic mammo and [place] of right breast. Thanks.

## 2021-04-26 NOTE — Telephone Encounter (Signed)
-----   Message from Earlie Server, MD sent at 04/25/2021  9:20 PM EDT ----- Please arrange her to do Diagnostic mammogram and possibly ultrasound of the right breast

## 2021-05-11 ENCOUNTER — Ambulatory Visit
Admission: RE | Admit: 2021-05-11 | Discharge: 2021-05-11 | Disposition: A | Discharge status: Home / Self Care | Payer: Medicare Other | Source: Ambulatory Visit | Attending: Oncology | Admitting: Oncology

## 2021-05-11 ENCOUNTER — Other Ambulatory Visit: Payer: Self-pay

## 2021-05-11 DIAGNOSIS — N6489 Other specified disorders of breast: Secondary | ICD-10-CM | POA: Insufficient documentation

## 2021-05-11 DIAGNOSIS — R921 Mammographic calcification found on diagnostic imaging of breast: Secondary | ICD-10-CM | POA: Insufficient documentation

## 2021-05-11 DIAGNOSIS — R928 Other abnormal and inconclusive findings on diagnostic imaging of breast: Secondary | ICD-10-CM

## 2021-08-09 ENCOUNTER — Other Ambulatory Visit: Payer: Self-pay

## 2021-08-09 ENCOUNTER — Inpatient Hospital Stay (HOSPITAL_BASED_OUTPATIENT_CLINIC_OR_DEPARTMENT_OTHER): Payer: Medicare Other | Admitting: Oncology

## 2021-08-09 ENCOUNTER — Inpatient Hospital Stay: Payer: Medicare Other | Attending: Oncology

## 2021-08-09 ENCOUNTER — Encounter: Payer: Self-pay | Admitting: Oncology

## 2021-08-09 VITALS — BP 135/71 | HR 72 | Temp 97.2°F | Wt 176.0 lb

## 2021-08-09 DIAGNOSIS — Z853 Personal history of malignant neoplasm of breast: Secondary | ICD-10-CM | POA: Diagnosis present

## 2021-08-09 LAB — CBC WITH DIFFERENTIAL/PLATELET
Abs Immature Granulocytes: 0.02 10*3/uL (ref 0.00–0.07)
Basophils Absolute: 0.1 10*3/uL (ref 0.0–0.1)
Basophils Relative: 1 %
Eosinophils Absolute: 0.3 10*3/uL (ref 0.0–0.5)
Eosinophils Relative: 5 %
HCT: 42.3 % (ref 36.0–46.0)
Hemoglobin: 13.7 g/dL (ref 12.0–15.0)
Immature Granulocytes: 0 %
Lymphocytes Relative: 31 %
Lymphs Abs: 2 10*3/uL (ref 0.7–4.0)
MCH: 30.5 pg (ref 26.0–34.0)
MCHC: 32.4 g/dL (ref 30.0–36.0)
MCV: 94.2 fL (ref 80.0–100.0)
Monocytes Absolute: 0.5 10*3/uL (ref 0.1–1.0)
Monocytes Relative: 8 %
Neutro Abs: 3.5 10*3/uL (ref 1.7–7.7)
Neutrophils Relative %: 55 %
Platelets: 344 10*3/uL (ref 150–400)
RBC: 4.49 MIL/uL (ref 3.87–5.11)
RDW: 13.2 % (ref 11.5–15.5)
WBC: 6.4 10*3/uL (ref 4.0–10.5)
nRBC: 0 % (ref 0.0–0.2)

## 2021-08-09 LAB — COMPREHENSIVE METABOLIC PANEL
ALT: 20 U/L (ref 0–44)
AST: 25 U/L (ref 15–41)
Albumin: 4 g/dL (ref 3.5–5.0)
Alkaline Phosphatase: 65 U/L (ref 38–126)
Anion gap: 6 (ref 5–15)
BUN: 13 mg/dL (ref 8–23)
CO2: 29 mmol/L (ref 22–32)
Calcium: 8.9 mg/dL (ref 8.9–10.3)
Chloride: 99 mmol/L (ref 98–111)
Creatinine, Ser: 0.69 mg/dL (ref 0.44–1.00)
GFR, Estimated: >60 mL/min (ref >60)
Glucose, Bld: 95 mg/dL (ref 70–99)
Potassium: 4.5 mmol/L (ref 3.5–5.1)
Sodium: 134 mmol/L — ABNORMAL LOW (ref 135–145)
Total Bilirubin: 0.4 mg/dL (ref 0.3–1.2)
Total Protein: 7.9 g/dL (ref 6.5–8.1)

## 2021-08-09 NOTE — Progress Notes (Signed)
Hematology/Oncology Progress note [phone] [phone]      Patient Care Team: Gladstone Lighter, MD as PCP - General (Internal Medicine)  REFERRING PROVIDER: Gladstone Lighter, MD  CHIEF COMPLAINTS/REASON FOR VISIT:  Follow up for breast cancer  HISTORY OF PRESENTING ILLNESS:   Kimberly Hatfield is a  86 y.o.  female with PMH listed below was seen in consultation at the request of  Gladstone Lighter, MD  for evaluation of history of breast cancer.  Patient has a history of left breast cancer.  Her previous cancer care was at [place]. Patient moved to [facility] and wants to establish care with local oncologist. Patient brought past medical records and extensive medical record review was performed by me.  05/08/2018, screening mammogram showed right breast negative, left breast small 6 mm mass 6:00, additional 7 or 8 mm mass at 9:00.  Biopsy showed 5:00 breast mass was positive for invasive mammary carcinoma, ER/PR positive HER-2 negative.  9:00 breast mass biopsy showed intraductal papilloma. 06/27/2018 left partial mastectomy and sentinel lymph node biopsy.  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  Sentinel lymph nodes were negative. Received radiation to the left breast with a dose of 42.56 Gy in 16 fractions followed by boost to the left lumpectomy cavity for an additional 10 Gray in 4 fractions.  Total dose was 52.56 Gy in 20 fractions. 10/10/2018 started on Arimidex. 10/31/2018, Arimidex was held due to severe hot flashes. 11/24/2018, switched to Aromasin.  She stopped Aromasin treatments about 1 month ago.  She reports feeling that both Arimidex and Aromasin have caused severe fatigue, hot flash.  Patient has been on Effexor chronically  which did not help her vasomotor symptoms. She is not interested in switching to Tamoxifen  08/18/2020 seen by Dr.Cintron for palpable knot along her surgery site which was felt ot be consistent with scar tissue clinically.   INTERVAL HISTORY Kimberly Hatfield is a 86 y.o. female who has above history reviewed by me today presents for follow up visit for history of breast cancer. Patient reports feeling well today.  No new complaints.  04/26/2021, bilateral screening mammogram showed a possible asymmetry in a separate group of calcification in right breast.  Warrants further evaluation. 05/11/2021 unilateral right diagnostic mammogram and ultrasound showed no persistent breast asymmetry.  Benign right breast calcification.  Review of Systems  Constitutional:  Negative for appetite change, chills, fatigue and fever.  HENT:   Negative for hearing loss and voice change.   Eyes:  Negative for eye problems.  Respiratory:  Negative for chest tightness and cough.   Cardiovascular:  Negative for chest pain.  Gastrointestinal:  Negative for abdominal distention, abdominal pain and blood in stool.  Endocrine: Negative for hot flashes.  Genitourinary:  Negative for difficulty urinating and frequency.   Musculoskeletal:  Negative for arthralgias.  Skin:  Negative for itching and rash.  Neurological:  Negative for extremity weakness.  Hematological:  Negative for adenopathy.  Psychiatric/Behavioral:  Negative for confusion.    MEDICAL HISTORY:  Past Medical History:  Diagnosis Date   Breast cancer ([place]) 2019   left breast   Depression    Glaucoma    Osteoporosis    Personal history of radiation therapy 2020   left breast ca     SURGICAL HISTORY: Past Surgical History:  Procedure Laterality Date  BREAST BIOPSY Left 06/06/2018   6:00 [facility]   BREAST BIOPSY Left 06/06/2018   9:00 intarductal papilloma   BREAST LUMPECTOMY Left 06/2018   mucinous carcinoma grade 2 clear margins, negative LN    CHOLECYSTECTOMY     left breast lumpectomy      SOCIAL HISTORY: Social History   Socioeconomic History   Marital status: Widowed    Spouse name: Not on file   Number of children: Not on file   Years of education: Not on file   Highest education level: Not on file  Occupational History   Not on file  Tobacco Use   Smoking status: Never   Smokeless tobacco: Never  Vaping Use   Vaping Use: Never used  Substance and Sexual Activity   Alcohol use: Not Currently    Comment: occasional glass of wine    Drug use: Never   Sexual activity: Not on file  Other Topics Concern   Not on file  Social History Narrative   Not on file   Social Determinants of Health   Financial Resource Strain: Not on file  Food Insecurity: Not on file  Transportation Needs: Not on file  Physical Activity: Not on file  Stress: Not on file  Social Connections: Not on file  Intimate Partner Violence: Not on file    FAMILY HISTORY: Family History  Problem Relation Age of Onset   Heart disease Mother    CAD Mother    Heart disease Father    CAD Father     ALLERGIES:  has no allergies on file.  MEDICATIONS:  Current Outpatient Medications  Medication Sig Dispense Refill   brimonidine-timolol (COMBIGAN) 0.2-0.5 % ophthalmic solution 1 drop 2 (two) times daily     esomeprazole (NEXIUM) 20 MG capsule Take by mouth.     Travoprost, BAK Free, (TRAVATAN) 0.004 % SOLN ophthalmic solution      traZODone (DESYREL) 100 MG tablet Take by mouth.     venlafaxine XR (EFFEXOR-XR) 150 MG 24 hr capsule Take by mouth.     No current facility-administered medications for this visit.     PHYSICAL EXAMINATION: ECOG PERFORMANCE STATUS: 0 - Asymptomatic Vitals:   08/09/21 1048  BP: 135/71  Pulse: 72  Temp: (!) 97.2 F (36.2 C)   Filed Weights   08/09/21 1048  Weight: 176 lb (79.8 kg)    Physical Exam Constitutional:      General: She is not in acute distress. HENT:     Head: Normocephalic and  atraumatic.  Eyes:     General: No scleral icterus. Cardiovascular:     Rate and Rhythm: Normal rate and regular rhythm.     Heart sounds: Normal heart sounds.  Pulmonary:     Effort: Pulmonary effort is normal. No respiratory distress.     Breath sounds: No wheezing.  Abdominal:     General: Bowel sounds are normal. There is no distension.     Palpations: Abdomen is soft.  Musculoskeletal:        General: No deformity. Normal range of motion.     Cervical back: Normal range of motion and neck supple.  Skin:    General: Skin is warm and dry.     Findings: No erythema or rash.  Neurological:     Mental Status: She is alert and oriented to person, place, and time. Mental status is at baseline.     Cranial Nerves: No cranial nerve deficit.     Coordination: Coordination normal.  Psychiatric:  Mood and Affect: Mood normal.   Breast exam was performed in seated and lying down position. Patient is status post left lumpectomy with a well-healed surgical scar, minimum tissue thickening.  No palpable mass in the right breast.  No palpable lymphadenopathy bilateral axilla   LABORATORY DATA:  I have reviewed the data as listed Lab Results  Component Value Date   WBC 6.4 08/09/2021   HGB 13.7 08/09/2021   HCT 42.3 08/09/2021   MCV 94.2 08/09/2021   PLT 344 08/09/2021   Recent Labs    08/10/20 1345 02/06/21 0936 08/09/21 1021  NA 136 138 134*  K 4.5 4.5 4.5  CL 100 102 99  CO2 _0 GLUCOSE 110* 118* 95  BUN _1 CREATININE 0.77 0.75 0.69  CALCIUM 9.0 8.9 8.9  GFRNONAA >60 >60 >60  PROT 7.4 7.3 7.9  ALBUMIN 3.7 3.6 4.0  AST _2 ALT _3 ALKPHOS 55 62 65  BILITOT 0.6 0.4 0.4    Iron/TIBC/Ferritin/ %Sat No results found for: IRON, TIBC, FERRITIN, IRONPCTSAT    RADIOGRAPHIC STUDIES: I have personally reviewed the radiological images as listed and agreed with the findings in the report. No results found.     ASSESSMENT & PLAN:  1.  History of breast cancer     #History of left stage I breast cancer,pT1c pN0, ER/PR positive, HER-2 negative.  Lumpectomy in 2020 January, status post adjuvant radiation. Declined anti estrogen treatment due to poor tolerance to 2 different AI and she is not interested in trying Tamoxifen.  Patient will need bilateral screening mammogram in November 2023.  We will obtain at the next visit.   All questions were answered. The patient knows to call the clinic with any problems questions or concerns.  cc Gladstone Lighter, MD    Return of visit: 6 months   Earlie Server, MD, PhD Hematology Oncology  08/09/2021

## 2021-10-10 ENCOUNTER — Telehealth: Payer: Self-pay | Admitting: Oncology

## 2021-10-10 NOTE — Telephone Encounter (Signed)
Spoke with patient get more details. Patient states the pain is in her left breast. She states that pain comes and goes and is not constant. Patient denies any fever, chills , or sob. Patient denies any pain today. Informed patient per Dr. Tasia Catchings, this can be common after breast surgery. Advised to continue to monitor and if pain progresses then to give [place] a call back. Patient verbalized understanding.  ?

## 2021-10-10 NOTE — Telephone Encounter (Signed)
Patient called and stated that she is having "stabbing pain and tingling" to her left breast.  ? ?Please advise ?

## 2021-10-10 NOTE — Telephone Encounter (Signed)
Pt contacted for further information. No answer, left message to return my call.  ?

## 2021-12-13 ENCOUNTER — Encounter: Payer: Self-pay | Admitting: Oncology

## 2022-02-07 ENCOUNTER — Ambulatory Visit: Payer: Medicare Other | Attending: Internal Medicine

## 2022-02-07 DIAGNOSIS — R278 Other lack of coordination: Secondary | ICD-10-CM | POA: Diagnosis present

## 2022-02-07 DIAGNOSIS — M6289 Other specified disorders of muscle: Secondary | ICD-10-CM | POA: Insufficient documentation

## 2022-02-07 DIAGNOSIS — M6281 Muscle weakness (generalized): Secondary | ICD-10-CM | POA: Diagnosis present

## 2022-02-07 NOTE — Therapy (Signed)
OUTPATIENT PHYSICAL THERAPY FEMALE PELVIC EVALUATION   Patient Name: Unknown Flannigan MRN: [id number] DOB:1935/11/20, 86 y.o., female Today's Date: 02/07/2022   PT End of Session - 02/07/22 1306     Visit Number 1    Number of Visits 10    Date for PT Re-Evaluation 04/18/22    Authorization Type IE: 02/07/22    PT Start Time 1315    PT Stop Time 0354    PT Time Calculation (min) 40 min    Activity Tolerance Patient tolerated treatment well             Past Medical History:  Diagnosis Date   Breast cancer ([place]) 2019   left breast   Depression    Glaucoma    Osteoporosis    Personal history of radiation therapy 2020   left breast ca    Past Surgical History:  Procedure Laterality Date   BREAST BIOPSY Left 06/06/2018   6:00 [facility]   BREAST BIOPSY Left 06/06/2018   9:00 intarductal papilloma   BREAST LUMPECTOMY Left 06/2018   mucinous carcinoma grade 2 clear margins, negative LN   CHOLECYSTECTOMY     left breast lumpectomy     Patient Active Problem List   Diagnosis Date Noted   Hx of lumpectomy 08/10/2020   History of breast cancer 08/10/2020   Depression 01/07/2020    PCP: Gladstone Lighter, MD  REFERRING PROVIDER: Gladstone Lighter, MD   REFERRING DIAG:  N39.41 (ICD-10-CM) - Urge incontinence   THERAPY DIAG:  Pelvic floor dysfunction  Other lack of coordination  Muscle weakness (generalized)  Rationale for Evaluation and Treatment: Rehabilitation  ONSET DATE: About a year ago   RED FLAGS: N/A Have you had any night sweats? Unexplained weight loss? Saddle anesthesia? Unexplained changes in bowel or bladder habits?   SUBJECTIVE: Patient confirms identification and approves PT to assess pelvic floor and treatment Yes                                                                                                                                                                                           PRECAUTIONS: None  WEIGHT BEARING  RESTRICTIONS: No  FALLS:  Has patient fallen in last 6 months? No  OCCUPATION/SOCIAL ACTIVITIES: Retired Hydrographic surveyor, walking, walking the dog, still active with florist/gardening   PLOF: Independent  PERTINENT HISTORY/CHART REVIEW: Hx of L breast cancer:  Patient will need bilateral screening mammogram in November 2023.  We will obtain at the next visit.    CHIEF CONCERN: Pt has increased urinary urgency and has progressively gotten worse. Pt cannot hold urine back and will have urinary leakage. Pt  notices more urinary leakage at night if she has to get up and cannot hold the urine back.    PAIN:  Are you having pain? No   LIVING ENVIRONMENT: Lives with: lives alone, kids, sisters nearby Lives in: House/apartment   PATIENT GOALS: Pt would like to have control of my bladder    UROLOGICAL HISTORY Fluid intake: 2 cups of coffee in morning, water, water at night (8oz), sweet tea   Pain with urination: No Fully empty bladder: Yes  Just in case: Yes Urgency: Yes, parking and getting out of the driveway and keys into the door  Frequency: 6x/day  Nocturia: 1x Leakage: Urge to void, Walking to the bathroom, Coughing, Sneezing, and Bending forward Pads: Yes but outside the home, has to change 3x/week undergarments at night  Type: pantyliner  Amount: 1x Bladder control (0-10): 2/10  GASTROINTESTINAL HISTORY Pt has no concerns   SEXUAL HISTORY/FUNCTION Pt has no concerns   OBSTETRICAL HISTORY Vaginal deliveries: G3P2 Tearing: very little with both C-section deliveries: none  GYNECOLOGICAL HISTORY Hysterectomy: yes, vaginal  Pelvic Organ Prolapse: None Pain with exam: no Heaviness/pressure: no    OBJECTIVE:    COGNITION: Overall cognitive status: Within functional limits for tasks assessed     POSTURE:  Grossly  Lumbar lordosis:   Thoracic kyphosis: Deferred 2/2 time constraints  Iliac crest height:  Lumbar lateral shift:  Pelvic obliquity:  Leg  length discrepancy:   GAIT: Deferred 2/2 time constraints  Distance walked:  Comments:   Trendelenburg:   SENSATION: Deferred 2/2 time constraints  Light touch: , L2-S2 dermatomes  Proprioception:    RANGE OF MOTION:  Deferred 2/2 time constraints   (Norm range in degrees)  LEFT eval RIGHT eval  Lumbar forward flexion (65):      Lumbar extension (30):     Lumbar lateral flexion (25):     Thoracic and Lumbar rotation (30 degrees):       Hip Flexion (0-125):      Hip IR (0-45):     Hip ER (0-45):     Hip Adduction:      Hip Abduction (0-40):     Hip extension (0-15):     (*= pain, Blank rows = not tested)   STRENGTH: MMT  Deferred 2/2 time constraints   RLE eval LLE eval  Hip Flexion    Hip Extension    Hip Abduction     Hip Adduction     Hip ER     Hip IR     Knee Extension    Knee Flexion    Dorsiflexion     Plantarflexion (seated)    (*= pain, Blank rows = not tested)   SPECIAL TESTS: Deferred 2/2 time constraints  Centralization and Peripheralization (SN 92, -LR 0.12):  Slump (SN 83, -LR 0.32):  SLR (SN 92, -LR 0.29): R: Lumbar quadrant (SN 70): R:  FABER (SN 81): FADIR (SN 94):  Hip scour (SN 50):  Thigh Thrust (SN 88, -LR 0.18) : Distraction (FO27):  Compression (SN/SP 69): Stork/March (SP 93):   PHYSICAL PERFORMANCE MEASURES: Deferred 2/2 time constraints   STS:  RLE SLS:  LLE SLS:  6 MWT:  10MWT:  5TSTS:   PALPATION: Deferred 2/2 time constraints  Abdominal:  Diastasis:  finger above umbilicus,  fingers at and below umbilicus  Scar mobility: present/mobile perpendicular, parallel Rib flare: present/absent  EXTERNAL PELVIC EXAM: Patient educated on the purpose of the pelvic exam and articulated understanding; patient consented to the exam verbally.  Deferred 2/2 time constraints  Palpation: Breath coordination: present/absent/inconsistent Voluntary Contraction: present/absent Relaxation: full/delayed/non-relaxing Perineal movement  with sustained IAP increase ("bear down"): descent/no change/elevation/excessive descent Perineal movement with rapid IAP increase ("cough"): elevation/no change/descent Pubic symphysis: (0= no contraction, 1= flicker, 2= weak squeeze, 3= fair squeeze with lift, 4= good squeeze and lift against resistance, 5= strong squeeze against strong resistance)   INTERNAL PELVIC EXAM: Patient educated on the purpose of the pelvic exam and articulated understanding; patient consented to the exam verbally. Deferred 2/2 to time constraints Introitus Appears:  Skin integrity:  Scar mobility: Strength (PERF):  Symmetry: Palpation: Prolapse: (0= no contraction, 1= flicker, 2= weak squeeze, 3= fair squeeze with lift, 4= good squeeze and lift against resistance, 5= strong squeeze against strong resistance)     Patient Education:  Patient educated on what to expect during course of physical therapy, POC, and provided with HEP including: bladder irritants handout. Patient verbalized understanding and returned demonstration. Patient will benefit from further education in order to maximize compliance and understanding for long-term therapeutic gains.   Patient Surveys:  FOTO Urinary Problem - 54   ASSESSMENT:  Clinical Impression: Patient is a 86 y.o. who was seen today for physical therapy evaluation and treatment for a chief concern of urinary leakage. Today's evaluation suggest deficits in IAP coordination, PFM coordination, PFM strength, PFM endurance and posture as evidenced by crossed legs in sitting, urinary leakage with bending forward/walking to the bathroom/urge to void/occasionally with sneezing and coughing, having to change undergarments 3x/week due to urinary leakage, use of pantyliner out in the community, increased urinary urgency, and hx of radiation due to cancer in the L breast (can affect PFM). Patient's responses on FOTO Urinary Problem (54) indicates moderate  limitation/disability/distress. Patient's progress may be limited due to age-related changes; however, patient's motivation is advantageous. Pt with basic understanding of PFM function in bowel/bladder habits, the deep core, breathing, and posture. Patient will benefit from skilled therapeutic intervention to address deficits in IAP coordination, PFM coordination, PFM strength, PFM endurance and posture  in order to increase PLOF and improve overall QOL.    Objective Impairments: decreased coordination, decreased endurance, decreased strength, improper body mechanics, and postural dysfunction.   Activity Limitations: bending, continence, and locomotion level  Personal Factors: Age, Behavior pattern, Past/current experiences, and 1-2 comorbidities: osteoporosis, depression  are also affecting patient's functional outcome.   Rehab Potential: Good  Clinical Decision Making: Evolving/moderate complexity  Evaluation Complexity: Moderate   GOALS: Goals reviewed with patient? Yes  SHORT TERM GOALS: Target date: 03/14/2022  Patient will demonstrate independent and coordinated diaphragmatic breathing in supine with a 1:2 breathing pattern for improved down-regulation of the nervous system and improved management of intra-abdominal pressures in order to increase function at home and in the community. Baseline: will assess next visit Goal status: INITIAL   LONG TERM GOALS: Target date: 04/18/2022   Patient will score  >/= 60 on FOTO Urinary Problem  in order to demonstrate improved IAP management, improved PFM coordination, and overall QOL.  Baseline: 54 Goal status: INITIAL  2.  Patient will report confidence in ability to control bladder > 6/10 in order to demonstrate improved function and ability to participate more fully in activities at home and in the community. Baseline: 2/10 Goal status: INITIAL  3.  Patient will report decreased frequency of changing undergarments as indicated by a  24 hour period to demonstrate improved bladder control and allow for increased participation in activities outside of the home. Baseline: 3x/week  needs to change due to leakage Goal status: INITIAL  4.  Patient will report less than 3 incidents of stress urinary incontinence over the course of 3 weeks while coughing/sneezing/bending forward/walking to the bathroom/urge to void in order to demonstrate improved PFM coordination, strength, and function for improved overall QOL. Baseline: urinary leakage with urge to void/walking to bathroom, more than once a week for cough/sneeze Goal status: INITIAL  5.  Patient will demonstrate independence with HEP in order to maximize therapeutic gains and improve carryover from physical therapy sessions to ADLs in the home and community. Baseline: bladder irritants handout Goal status: INITIAL    PLAN: PT Frequency: 1x/week  PT Duration: 10 weeks  Planned Interventions: Therapeutic exercises, Therapeutic activity, Neuromuscular re-education, Balance training, Gait training, Patient/Family education, Self Care, Joint mobilization, Cryotherapy, Moist heat, scar mobilization, Taping, and Manual therapy  Plan For Next Session: phy assess   Alvino Lechuga, PT, DPT  02/07/2022, 4:18 PM

## 2022-02-08 ENCOUNTER — Other Ambulatory Visit: Payer: Medicare Other

## 2022-02-08 ENCOUNTER — Ambulatory Visit: Payer: Medicare Other | Admitting: Oncology

## 2022-02-09 ENCOUNTER — Other Ambulatory Visit: Payer: Self-pay

## 2022-02-09 DIAGNOSIS — Z853 Personal history of malignant neoplasm of breast: Secondary | ICD-10-CM

## 2022-02-12 ENCOUNTER — Encounter: Payer: Self-pay | Admitting: Oncology

## 2022-02-12 ENCOUNTER — Inpatient Hospital Stay: Payer: Medicare Other | Attending: Oncology

## 2022-02-12 ENCOUNTER — Inpatient Hospital Stay (HOSPITAL_BASED_OUTPATIENT_CLINIC_OR_DEPARTMENT_OTHER): Payer: Medicare Other | Admitting: Oncology

## 2022-02-12 VITALS — BP 114/58 | HR 66 | Temp 98.1°F | Resp 16 | Wt 181.2 lb

## 2022-02-12 DIAGNOSIS — Z1231 Encounter for screening mammogram for malignant neoplasm of breast: Secondary | ICD-10-CM | POA: Diagnosis not present

## 2022-02-12 DIAGNOSIS — Z853 Personal history of malignant neoplasm of breast: Secondary | ICD-10-CM | POA: Diagnosis present

## 2022-02-12 DIAGNOSIS — Z08 Encounter for follow-up examination after completed treatment for malignant neoplasm: Secondary | ICD-10-CM | POA: Diagnosis present

## 2022-02-12 LAB — COMPREHENSIVE METABOLIC PANEL
ALT: 15 U/L (ref 0–44)
AST: 22 U/L (ref 15–41)
Albumin: 3.6 g/dL (ref 3.5–5.0)
Alkaline Phosphatase: 68 U/L (ref 38–126)
Anion gap: 5 (ref 5–15)
BUN: 18 mg/dL (ref 8–23)
CO2: 29 mmol/L (ref 22–32)
Calcium: 8.8 mg/dL — ABNORMAL LOW (ref 8.9–10.3)
Chloride: 103 mmol/L (ref 98–111)
Creatinine, Ser: 0.73 mg/dL (ref 0.44–1.00)
GFR, Estimated: >60 mL/min (ref >60)
Glucose, Bld: 81 mg/dL (ref 70–99)
Potassium: 4.2 mmol/L (ref 3.5–5.1)
Sodium: 137 mmol/L (ref 135–145)
Total Bilirubin: 0.4 mg/dL (ref 0.3–1.2)
Total Protein: 7.3 g/dL (ref 6.5–8.1)

## 2022-02-12 LAB — CBC WITH DIFFERENTIAL/PLATELET
Abs Immature Granulocytes: 0.01 10*3/uL (ref 0.00–0.07)
Basophils Absolute: 0 10*3/uL (ref 0.0–0.1)
Basophils Relative: 1 %
Eosinophils Absolute: 0.4 10*3/uL (ref 0.0–0.5)
Eosinophils Relative: 7 %
HCT: 39.8 % (ref 36.0–46.0)
Hemoglobin: 13 g/dL (ref 12.0–15.0)
Immature Granulocytes: 0 %
Lymphocytes Relative: 29 %
Lymphs Abs: 1.6 10*3/uL (ref 0.7–4.0)
MCH: 31.1 pg (ref 26.0–34.0)
MCHC: 32.7 g/dL (ref 30.0–36.0)
MCV: 95.2 fL (ref 80.0–100.0)
Monocytes Absolute: 0.6 10*3/uL (ref 0.1–1.0)
Monocytes Relative: 10 %
Neutro Abs: 3 10*3/uL (ref 1.7–7.7)
Neutrophils Relative %: 53 %
Platelets: 308 10*3/uL (ref 150–400)
RBC: 4.18 MIL/uL (ref 3.87–5.11)
RDW: 13.2 % (ref 11.5–15.5)
WBC: 5.6 10*3/uL (ref 4.0–10.5)
nRBC: 0 % (ref 0.0–0.2)

## 2022-02-12 NOTE — Progress Notes (Signed)
Hematology/Oncology Progress note [phone] [phone]      Patient Care Team: Gladstone Lighter, MD as PCP - General (Internal Medicine)  REFERRING PROVIDER: Gladstone Lighter, MD  CHIEF COMPLAINTS/REASON FOR VISIT:  Follow up for breast cancer  HISTORY OF PRESENTING ILLNESS:   Kimberly Hatfield is a  86 y.o.  female with PMH listed below was seen in consultation at the request of  Gladstone Lighter, MD  for evaluation of history of breast cancer.  Patient has a history of left breast cancer.  Her previous cancer care was at [place]. Patient moved to [facility] and wants to establish care with local oncologist. Patient brought past medical records and extensive medical record review was performed by me.  05/08/2018, screening mammogram showed right breast negative, left breast small 6 mm mass 6:00, additional 7 or 8 mm mass at 9:00.  Biopsy showed 5:00 breast mass was positive for invasive mammary carcinoma, ER/PR positive HER-2 negative.  9:00 breast mass biopsy showed intraductal papilloma. 06/27/2018 left partial mastectomy and sentinel lymph node biopsy.  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  Sentinel lymph nodes were negative. Received radiation to the left breast with a dose of 42.56 Gy in 16 fractions followed by boost to the left lumpectomy cavity for an additional 10 Gray in 4 fractions.  Total dose was 52.56 Gy in 20 fractions. 10/10/2018 started on Arimidex. 10/31/2018, Arimidex was held due to severe hot flashes. 11/24/2018, switched to Aromasin.  She stopped Aromasin treatments about 1 month ago.  She reports feeling that both Arimidex and Aromasin have caused severe fatigue, hot flash.  Patient has been on Effexor chronically  which did not help her vasomotor symptoms. She is not interested in switching to Tamoxifen  08/18/2020 seen by Dr.Cintron for palpable knot along her surgery site which was felt ot be consistent with scar tissue clinically.   04/26/2021, bilateral screening mammogram showed a possible asymmetry in a separate group of calcification in right breast.  Warrants further evaluation. 05/11/2021 unilateral right diagnostic mammogram and ultrasound showed no persistent breast asymmetry.  Benign right breast calcification.  INTERVAL HISTORY Kimberly Hatfield is a 86 y.o. female who has above history reviewed by me today presents for follow up visit for history of breast cancer. Patient reports feeling well today.  No new complaints.  Review of Systems  Constitutional:  Negative for appetite change, chills, fatigue and fever.  HENT:   Negative for hearing loss and voice change.   Eyes:  Negative for eye problems.  Respiratory:  Negative for chest tightness and cough.   Cardiovascular:  Negative for chest pain.  Gastrointestinal:  Negative for abdominal distention, abdominal pain and blood in stool.  Endocrine: Negative for hot flashes.  Genitourinary:  Negative for difficulty urinating and frequency.   Musculoskeletal:  Negative for arthralgias.  Skin:  Negative for itching and rash.  Neurological:  Negative for extremity weakness.  Hematological:  Negative for adenopathy.  Psychiatric/Behavioral:  Negative for confusion.     MEDICAL HISTORY:  Past Medical History:  Diagnosis Date   Breast cancer ([place]) 2019   left breast   Depression    Glaucoma    Osteoporosis    Personal history of radiation therapy 2020   left breast ca     SURGICAL HISTORY: Past Surgical History:  Procedure Laterality Date  BREAST BIOPSY Left 06/06/2018   6:00 [facility]   BREAST BIOPSY Left 06/06/2018   9:00 intarductal papilloma   BREAST LUMPECTOMY Left 06/2018   mucinous carcinoma grade 2 clear margins, negative LN    CHOLECYSTECTOMY     left breast lumpectomy      SOCIAL HISTORY: Social History   Socioeconomic History   Marital status: Widowed    Spouse name: Not on file   Number of children: Not on file   Years of education: Not on file   Highest education level: Not on file  Occupational History   Not on file  Tobacco Use   Smoking status: Never   Smokeless tobacco: Never  Vaping Use   Vaping Use: Never used  Substance and Sexual Activity   Alcohol use: Not Currently    Comment: occasional glass of wine    Drug use: Never   Sexual activity: Not on file  Other Topics Concern   Not on file  Social History Narrative   Not on file   Social Determinants of Health   Financial Resource Strain: Not on file  Food Insecurity: Not on file  Transportation Needs: Not on file  Physical Activity: Not on file  Stress: Not on file  Social Connections: Not on file  Intimate Partner Violence: Not on file    FAMILY HISTORY: Family History  Problem Relation Age of Onset   Heart disease Mother    CAD Mother    Heart disease Father    CAD Father     ALLERGIES:  has No Known Allergies.  MEDICATIONS:  Current Outpatient Medications  Medication Sig Dispense Refill   brimonidine-timolol (COMBIGAN) 0.2-0.5 % ophthalmic solution 1 drop 2 (two) times daily     esomeprazole (NEXIUM) 20 MG capsule Take by mouth.     Travoprost, BAK Free, (TRAVATAN) 0.004 % SOLN ophthalmic solution      traZODone (DESYREL) 100 MG tablet Take by mouth.     venlafaxine XR (EFFEXOR-XR) 150 MG 24 hr capsule Take by mouth.     No current facility-administered medications for this visit.     PHYSICAL EXAMINATION: ECOG PERFORMANCE STATUS: 0 - Asymptomatic Vitals:   02/12/22 1045  BP: (!) 114/58  Pulse: 66  Resp: 16  Temp: 98.1 F (36.7 C)  SpO2: 96%   Filed Weights   02/12/22 1045  Weight: 181 lb 3.2 oz (82.2 kg)    Physical Exam Constitutional:      General: She is not in acute distress. HENT:      Head: Normocephalic and atraumatic.  Eyes:     General: No scleral icterus. Cardiovascular:     Rate and Rhythm: Normal rate and regular rhythm.     Heart sounds: Normal heart sounds.  Pulmonary:     Effort: Pulmonary effort is normal. No respiratory distress.     Breath sounds: No wheezing.  Abdominal:     General: Bowel sounds are normal. There is no distension.     Palpations: Abdomen is soft.  Musculoskeletal:        General: No deformity. Normal range of motion.     Cervical back: Normal range of motion and neck supple.  Skin:    General: Skin is warm and dry.     Findings: No erythema or rash.  Neurological:     Mental Status: She is alert and oriented to person, place, and time. Mental status is at baseline.     Cranial Nerves: No cranial nerve deficit.  Coordination: Coordination normal.  Psychiatric:        Mood and Affect: Mood normal.       LABORATORY DATA:  I have reviewed the data as listed Lab Results  Component Value Date   WBC 5.6 02/12/2022   HGB 13.0 02/12/2022   HCT 39.8 02/12/2022   MCV 95.2 02/12/2022   PLT 308 02/12/2022   Recent Labs    08/09/21 1021 02/12/22 1027  NA 134* 137  K 4.5 4.2  CL 99 103  CO2 29 29  GLUCOSE 95 81  BUN 13 18  CREATININE 0.69 0.73  CALCIUM 8.9 8.8*  GFRNONAA >60 >60  PROT 7.9 7.3  ALBUMIN 4.0 3.6  AST 25 22  ALT 20 15  ALKPHOS 65 68  BILITOT 0.4 0.4    Iron/TIBC/Ferritin/ %Sat No results found for: "IRON", "TIBC", "FERRITIN", "IRONPCTSAT"    RADIOGRAPHIC STUDIES: I have personally reviewed the radiological images as listed and agreed with the findings in the report. No results found.     ASSESSMENT & PLAN:  1. History of breast cancer   2. Screening mammogram, encounter for     #History of left stage I breast cancer,pT1c pN0, ER/PR positive, HER-2 negative.  Lumpectomy in 2020 January, status post adjuvant radiation. Patient previously declined endocrine therapy due to poor tolerance to  2 different AI and she is not interested in tamoxifen. Clinically she is doing well.  continue annual mammogram.  patient will need bilateral screening mammogram in November 2023.  Marland Kitchen Recommend patient to continue calcium and vitamin D supplementation.  All questions were answered. The patient knows to call the clinic with any problems questions or concerns.  cc Gladstone Lighter, MD    Return of visit: 6 months   Earlie Server, MD, PhD Hematology Oncology  02/12/2022

## 2022-02-12 NOTE — Progress Notes (Signed)
Pt reports she is doing well and denies any concerns today.

## 2022-02-14 ENCOUNTER — Ambulatory Visit: Payer: Medicare Other

## 2022-02-14 DIAGNOSIS — M6289 Other specified disorders of muscle: Secondary | ICD-10-CM

## 2022-02-14 DIAGNOSIS — M6281 Muscle weakness (generalized): Secondary | ICD-10-CM

## 2022-02-14 DIAGNOSIS — R278 Other lack of coordination: Secondary | ICD-10-CM

## 2022-02-14 NOTE — Therapy (Signed)
OUTPATIENT PHYSICAL THERAPY FEMALE PELVIC TREATMENT   Patient Name: Kimberly Hatfield MRN: [id number] DOB:[birth year], 86 y.o., female Today's Date: 02/14/2022   PT End of Session - 02/14/22 1313     Visit Number 2    Number of Visits 10    Date for PT Re-Evaluation 04/18/22    Authorization Type IE: 02/07/22    PT Start Time 1315    PT Stop Time 3810    PT Time Calculation (min) 40 min    Activity Tolerance Patient tolerated treatment well             Past Medical History:  Diagnosis Date   Breast cancer ([place]) 2019   left breast   Depression    Glaucoma    Osteoporosis    Personal history of radiation therapy 2020   left breast ca    Past Surgical History:  Procedure Laterality Date   BREAST BIOPSY Left 06/06/2018   6:00 [facility]   BREAST BIOPSY Left 06/06/2018   9:00 intarductal papilloma   BREAST LUMPECTOMY Left 06/2018   mucinous carcinoma grade 2 clear margins, 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   CHOLECYSTECTOMY     left breast lumpectomy     Patient Active Problem List   Diagnosis Date Noted   Hx of lumpectomy 08/10/2020   History of breast cancer 08/10/2020   Depression 01/07/2020    PCP: Gladstone Lighter, MD  REFERRING PROVIDER: Gladstone Lighter, MD   REFERRING DIAG:  N39.41 (ICD-10-CM) - Urge incontinence   THERAPY DIAG:  Pelvic floor dysfunction  Other lack of coordination  Muscle weakness (generalized)  Rationale for Evaluation and Treatment: Rehabilitation  ONSET DATE: About a year ago   PRECAUTIONS: None  WEIGHT BEARING RESTRICTIONS: No  FALLS:  Has patient fallen in last 6 months? No  OCCUPATION/SOCIAL ACTIVITIES: Retired Hydrographic surveyor, walking, walking the dog, still active with florist/gardening   PLOF: Independent  PERTINENT HISTORY/CHART REVIEW: Hx of L breast cancer:  Patient will need bilateral screening mammogram in November 2023.  We will obtain at the next visit.    CHIEF CONCERN: Pt has increased urinary urgency and has  progressively gotten worse. Pt cannot hold urine back and will have urinary leakage. Pt notices more urinary leakage at night if she has to get up and cannot hold the urine back.    LIVING ENVIRONMENT: Lives with: lives alone, kids, sisters nearby Lives in: House/apartment   PATIENT GOALS: Pt would like to have control of my bladder    UROLOGICAL HISTORY Fluid intake: 2 cups of coffee in morning, water, water at night (8oz), sweet tea   Pain with urination: No Fully empty bladder: Yes  Just in case: Yes Urgency: Yes, parking and getting out of the driveway and keys into the door  Frequency: 6x/day  Nocturia: 1x Leakage: Urge to void, Walking to the bathroom, Coughing, Sneezing, and Bending forward Pads: Yes but outside the home, has to change 3x/week undergarments at night  Type: pantyliner  Amount: 1x Bladder control (0-10): 2/10  GASTROINTESTINAL HISTORY Pt has no concerns   SEXUAL HISTORY/FUNCTION Pt has no concerns   OBSTETRICAL HISTORY Vaginal deliveries: G3P2 Tearing: very little with both C-section deliveries: none  GYNECOLOGICAL HISTORY Hysterectomy: yes, vaginal  Pelvic Organ Prolapse: None Pain with exam: no Heaviness/pressure: no    SUBJECTIVE:  Pt has no changes from IE.   PAIN:  Are you having pain?      No  TODAY'S TREATMENT  Neuromuscular Re-education:  Pre-treatment assessment  OBJECTIVE:    COGNITION: Overall cognitive status: Within functional limits for tasks assessed     POSTURE:   Iliac crest height: L iliac crest higher Shoulder height: increased on L Pelvic obliquity: WNL   RANGE OF MOTION:   (Norm range in degrees)  LEFT 02/14/22 RIGHT 02/14/22  Lumbar forward flexion (65):  WNL    Lumbar extension (30): WNL    Lumbar lateral flexion (25):  WNL WNL   Thoracic and Lumbar rotation (30 degrees):    WNL WNL  Hip Flexion (0-125):   WNL WNL  Hip IR (0-45):  WNL WNL  Hip ER (0-45):  WNL WNL  Hip Adduction:      Hip Abduction (0-40):  WNL WNL  Hip extension (0-15):     (*= pain, Blank rows = not tested)   STRENGTH: MMT  *posterior lean during most of MMT  RLE 02/14/22 LLE 02/14/22  Hip Flexion 4 4  Hip Extension 4 4  Hip Abduction     Hip Adduction     Hip ER  4 5  Hip IR  5 5  Knee Extension 5 5  Knee Flexion 4 5  Dorsiflexion     Plantarflexion (seated) 5 5  (*= pain, Blank rows = not tested)   SPECIAL TESTS:  FABER (SN 81): negative B FADIR (SN 94): negative B    PALPATION:  Abdominal:  Upper quadrant with increased tension upon palpation but no pain/discomfort   Lower quadrants WNL   EXTERNAL PELVIC EXAM: Patient educated on the purpose of the pelvic exam and articulated understanding; patient consented to the exam verbally.  Breath coordination: present but inconsistent Voluntary Contraction: present, 2/5 MMT after cueing to decrease gluteal activation Relaxation: full Perineal movement with sustained IAP increase ("bear down"): minimal descent with Valsalva noted Perineal movement with rapid IAP increase ("cough"): no change (0= no contraction, 1= flicker, 2= weak squeeze, 3= fair squeeze with lift, 4= good squeeze and lift against resistance, 5= strong squeeze against strong resistance)   Neuromuscular Re-education: Supine hooklying diaphragmatic breathing with VCs and TCs for downregulation of the nervous system and improved management of IAP  Supine piriformis pose for improved PFM lengthening and improved tissue extensibility   Right Sidelying thoracic rotations, x10, for improved lengthening of the anterior fascial slings, VCs and TCs required   Discussion on completing bladder diary to obtain information on bladder habits for next session   Patient response to interventions: Pt able to feel stretch  at L lateral musculature during thoracic rotation   Patient Education:  Patient provided with HEP including: supine piriformis pose, supine diaphragmatic breathing and sidelying thoracic rotation. Patient verbalized understanding and returned demonstration. Patient will benefit from further education in order to maximize compliance and understanding for long-term therapeutic gains.   ASSESSMENT:  Clinical Impression: Patient presents to clinic with excellent motivation to participate in today's session. Upon physical assessment, Pt demonstrates deficits in  IAP coordination, PFM coordination, PFM strength, PFM endurance LE strength, scar mobility and posture as evidenced by increased L iliac crest height, increased L shoulder height, 4/5 MMT B hip flex/ext and R ER and knee flexion, posterior lean during MMT, intermittent pain at the L breast scar, increased fascial tension in upper quadrants upon palpation, present but inconsistent breath coordination, minimal descent with sustained IAP increase ("bear down") with noted Valsalva maneuver, 2/5 MMT of PFM with gluteal activation and Valsalva maneuver. Pt required moderate VCs and  TCs for proper technique and decreased bodily compensations. Patient will benefit from skilled therapeutic intervention to address deficits in IAP coordination, PFM coordination, PFM strength, PFM endurance and posture  in order to increase PLOF and improve overall QOL.    Objective Impairments: decreased coordination, decreased endurance, decreased strength, improper body mechanics, and postural dysfunction.   Activity Limitations: bending, continence, and locomotion level  Personal Factors: Age, Behavior pattern, Past/current experiences, and 1-2 comorbidities: osteoporosis, depression  are also affecting patient's functional outcome.   Rehab Potential: Good  Clinical Decision Making: Evolving/moderate complexity  Evaluation Complexity: Moderate   GOALS: Goals  reviewed with patient? Yes  SHORT TERM GOALS: Target date: 03/21/2022  Patient will demonstrate independent and coordinated diaphragmatic breathing in supine with a 1:2 breathing pattern for improved down-regulation of the nervous system and improved management of intra-abdominal pressures in order to increase function at home and in the community. Baseline: present but inconsistent breath Goal status: INITIAL   LONG TERM GOALS: Target date: 04/25/2022   Patient will score  >/= 60 on FOTO Urinary Problem  in order to demonstrate improved IAP management, improved PFM coordination, and overall QOL.  Baseline: 54 Goal status: INITIAL  2.  Patient will report confidence in ability to control bladder > 6/10 in order to demonstrate improved function and ability to participate more fully in activities at home and in the community. Baseline: 2/10 Goal status: INITIAL  3.  Patient will report decreased frequency of changing undergarments as indicated by a 24 hour period to demonstrate improved bladder control and allow for increased participation in activities outside of the home. Baseline: 3x/week needs to change due to leakage Goal status: INITIAL  4.  Patient will report less than 3 incidents of stress urinary incontinence over the course of 3 weeks while coughing/sneezing/bending forward/walking to the bathroom/urge to void in order to demonstrate improved PFM coordination, strength, and function for improved overall QOL. Baseline: urinary leakage with urge to void/walking to bathroom, more than once a week for cough/sneeze Goal status: INITIAL  5.  Patient will demonstrate independence with HEP in order to maximize therapeutic gains and improve carryover from physical therapy sessions to ADLs in the home and community. Baseline: bladder irritants handout Goal status: INITIAL    PLAN: PT Frequency: 1x/week  PT Duration: 10 weeks  Planned Interventions: Therapeutic exercises,  Therapeutic activity, Neuromuscular re-education, Balance training, Gait training, Patient/Family education, Self Care, Joint mobilization, Cryotherapy, Moist heat, scar mobilization, Taping, and Manual therapy  Plan For Next Session: bladder diary, how was breathing? PFM lengthen techniques    Evalyne Cortopassi, PT, DPT  02/14/2022, 1:14 PM

## 2022-02-21 ENCOUNTER — Ambulatory Visit: Payer: Medicare Other

## 2022-02-27 ENCOUNTER — Telehealth: Payer: Self-pay

## 2022-02-27 ENCOUNTER — Ambulatory Visit: Payer: Medicare Other | Attending: Internal Medicine

## 2022-02-27 NOTE — Telephone Encounter (Signed)
Spoke to Pt and is having upcoming surgery/medical appointments. She feels it is best to resume PFPT in October and will call if anything else changes.

## 2022-03-28 ENCOUNTER — Ambulatory Visit: Payer: Medicare Other

## 2022-03-28 NOTE — Therapy (Signed)
[place] [facility] [address] [place], [place], [address] Phone: [phone]   Fax:  [phone]  March 28, 2022   No Recipients  Physical Therapy Discharge Summary  Patient: Kimberly Hatfield  MRN: [id number]  Date of Birth: [birth year]   Diagnosis: No diagnosis found. No data recorded  The above patient had been seen in Physical Therapy 2 times of 10 treatments scheduled with 1 no shows and 2 cancellations.  The treatment consisted of improving IAP management, PFM coordination, PFM strength, and posture.  The patient is: Unchanged  Subjective:  IE: 02/07/22 Pt has increased urinary urgency and has progressively gotten worse. Pt cannot hold urine back and will have urinary leakage. Pt notices more urinary leakage at night if she has to get up and cannot hold the urine back.   Discharge Findings: Self-discharged  Functional Status at Discharge: Unchanged  No Goals Met   SHORT TERM GOALS: Target date: 03/21/2022  Patient will demonstrate independent and coordinated diaphragmatic breathing in supine with a 1:2 breathing pattern for improved down-regulation of the nervous system and improved management of intra-abdominal pressures in order to increase function at home and in the community. Baseline: present but inconsistent breath Goal status: INITIAL   LONG TERM GOALS: Target date: 04/25/2022   Patient will score  >/= 60 on FOTO Urinary Problem  in order to demonstrate improved IAP management, improved PFM coordination, and overall QOL.  Baseline: 54 Goal status: INITIAL  2.  Patient will report confidence in ability to control bladder > 6/10 in order to demonstrate improved function and ability to participate more fully in activities at home and in the community. Baseline: 2/10 Goal status: INITIAL  3.  Patient will report decreased frequency of changing undergarments as indicated by a 24 hour period to demonstrate  improved bladder control and allow for increased participation in activities outside of the home. Baseline: 3x/week needs to change due to leakage Goal status: INITIAL  4.  Patient will report less than 3 incidents of stress urinary incontinence over the course of 3 weeks while coughing/sneezing/bending forward/walking to the bathroom/urge to void in order to demonstrate improved PFM coordination, strength, and function for improved overall QOL. Baseline: urinary leakage with urge to void/walking to bathroom, more than once a week for cough/sneeze Goal status: INITIAL  5.  Patient will demonstrate independence with HEP in order to maximize therapeutic gains and improve carryover from physical therapy sessions to ADLs in the home and community. Baseline: bladder irritants handout Goal status: INITIAL  Sincerely,   Filbert Berthold, PT, DPT    CC No Recipients  [place] [facility] [address] [place], [place], [address] Phone: [phone]   Fax:  [phone]  Patient: Kimberly Hatfield  MRN: [id number]  Date of Birth: [birth year]

## 2022-04-04 ENCOUNTER — Ambulatory Visit: Payer: Medicare Other

## 2022-04-26 ENCOUNTER — Ambulatory Visit
Admission: RE | Admit: 2022-04-26 | Discharge: 2022-04-26 | Disposition: A | Discharge status: Home / Self Care | Payer: Medicare Other | Source: Ambulatory Visit | Attending: Oncology | Admitting: Oncology

## 2022-04-26 DIAGNOSIS — Z853 Personal history of malignant neoplasm of breast: Secondary | ICD-10-CM | POA: Insufficient documentation

## 2022-04-26 DIAGNOSIS — Z1231 Encounter for screening mammogram for malignant neoplasm of breast: Secondary | ICD-10-CM | POA: Diagnosis present

## 2022-08-12 ENCOUNTER — Other Ambulatory Visit: Payer: Self-pay

## 2022-08-12 ENCOUNTER — Emergency Department
Admission: EM | Admit: 2022-08-12 | Discharge: 2022-08-12 | Disposition: A | Discharge status: Home / Self Care | Payer: Medicare Other | Attending: Emergency Medicine | Admitting: Emergency Medicine

## 2022-08-12 ENCOUNTER — Encounter: Payer: Self-pay | Admitting: Intensive Care

## 2022-08-12 ENCOUNTER — Emergency Department: Payer: Medicare Other

## 2022-08-12 DIAGNOSIS — R1032 Left lower quadrant pain: Secondary | ICD-10-CM

## 2022-08-12 DIAGNOSIS — K5732 Diverticulitis of large intestine without perforation or abscess without bleeding: Secondary | ICD-10-CM | POA: Diagnosis not present

## 2022-08-12 DIAGNOSIS — K5792 Diverticulitis of intestine, part unspecified, without perforation or abscess without bleeding: Secondary | ICD-10-CM

## 2022-08-12 DIAGNOSIS — R1030 Lower abdominal pain, unspecified: Secondary | ICD-10-CM | POA: Diagnosis present

## 2022-08-12 LAB — COMPREHENSIVE METABOLIC PANEL
ALT: 22 U/L (ref 0–44)
AST: 32 U/L (ref 15–41)
Albumin: 3.4 g/dL — ABNORMAL LOW (ref 3.5–5.0)
Alkaline Phosphatase: 62 U/L (ref 38–126)
Anion gap: 8 (ref 5–15)
BUN: 11 mg/dL (ref 8–23)
CO2: 25 mmol/L (ref 22–32)
Calcium: 8.5 mg/dL — ABNORMAL LOW (ref 8.9–10.3)
Chloride: 101 mmol/L (ref 98–111)
Creatinine, Ser: 0.7 mg/dL (ref 0.44–1.00)
GFR, Estimated: >60 mL/min (ref >60)
Glucose, Bld: 135 mg/dL — ABNORMAL HIGH (ref 70–99)
Potassium: 3.6 mmol/L (ref 3.5–5.1)
Sodium: 134 mmol/L — ABNORMAL LOW (ref 135–145)
Total Bilirubin: 1.1 mg/dL (ref 0.3–1.2)
Total Protein: 7.6 g/dL (ref 6.5–8.1)

## 2022-08-12 LAB — CBC
HCT: 39.5 % (ref 36.0–46.0)
Hemoglobin: 13.2 g/dL (ref 12.0–15.0)
MCH: 30.5 pg (ref 26.0–34.0)
MCHC: 33.4 g/dL (ref 30.0–36.0)
MCV: 91.2 fL (ref 80.0–100.0)
Platelets: 350 10*3/uL (ref 150–400)
RBC: 4.33 MIL/uL (ref 3.87–5.11)
RDW: 12.9 % (ref 11.5–15.5)
WBC: 16.3 10*3/uL — ABNORMAL HIGH (ref 4.0–10.5)
nRBC: 0 % (ref 0.0–0.2)

## 2022-08-12 LAB — LIPASE, BLOOD: Lipase: 26 U/L (ref 11–51)

## 2022-08-12 MED ORDER — ONDANSETRON HCL 4 MG/2ML IJ SOLN
4.0000 mg | Freq: Once | INTRAMUSCULAR | Status: AC
  Administered 2022-08-12: 4 mg via INTRAVENOUS
  Filled 2022-08-12: qty 2

## 2022-08-12 MED ORDER — ONDANSETRON 4 MG PO TBDP: 4.0000 mg | ORAL_TABLET | Freq: Three times a day (TID) | ORAL | 0 refills | Status: DC | PRN

## 2022-08-12 MED ORDER — SODIUM CHLORIDE 0.9 % IV BOLUS
1000.0000 mL | Freq: Once | INTRAVENOUS | Status: AC
  Administered 2022-08-12: 1000 mL via INTRAVENOUS

## 2022-08-12 MED ORDER — FENTANYL CITRATE PF 50 MCG/ML IJ SOSY
50.0000 ug | PREFILLED_SYRINGE | Freq: Once | INTRAMUSCULAR | Status: AC
  Administered 2022-08-12: 50 ug via INTRAVENOUS
  Filled 2022-08-12: qty 1

## 2022-08-12 MED ORDER — SODIUM CHLORIDE 0.9 % IV SOLN
2.0000 g | Freq: Once | INTRAVENOUS | Status: AC
  Administered 2022-08-12: 2 g via INTRAVENOUS
  Filled 2022-08-12: qty 20

## 2022-08-12 MED ORDER — METRONIDAZOLE 500 MG/100ML IV SOLN
500.0000 mg | Freq: Once | INTRAVENOUS | Status: AC
  Administered 2022-08-12: 500 mg via INTRAVENOUS
  Filled 2022-08-12: qty 100

## 2022-08-12 MED ORDER — IOHEXOL 300 MG/ML  SOLN
100.0000 mL | Freq: Once | INTRAMUSCULAR | Status: AC | PRN
  Administered 2022-08-12: 100 mL via INTRAVENOUS

## 2022-08-12 MED ORDER — OXYCODONE-ACETAMINOPHEN 5-325 MG PO TABS: 1.0000 | ORAL_TABLET | ORAL | 0 refills | Status: AC | PRN

## 2022-08-12 MED ORDER — MORPHINE SULFATE (PF) 2 MG/ML IV SOLN
2.0000 mg | Freq: Once | INTRAVENOUS | Status: AC
  Administered 2022-08-12: 2 mg via INTRAVENOUS
  Filled 2022-08-12: qty 1

## 2022-08-12 MED ORDER — AMOXICILLIN-POT CLAVULANATE 875-125 MG PO TABS: 1.0000 | ORAL_TABLET | Freq: Two times a day (BID) | ORAL | 0 refills | Status: AC

## 2022-08-12 NOTE — Discharge Instructions (Signed)
You were seen in the emergency department and diagnosed with acute diverticulitis.  You are given your first dose of antibiotics in the emergency department.  Start your first dose of Augmentin tomorrow morning.  Call your primary care physician in the morning to schedule close follow-up in the next 1 to 2 days.  Return to the emergency department if your pain does not improve or worsens over the next couple of days.  You were given a prescription for narcotic pain medications.  Take only if in severe pain.  These are very addictive medications.  These medications can make you constipated.  If you need to take more than 1-2 doses, start a stool softner.  If you become constipated, take 1 capfull of MiraLAX, can repeat untill having regular bowel movements.  Keep this medication out of reach of any children.   You can take 600 mg of ibuprofen every 6 hours as needed for pain control.  You can take 650 mg of Tylenol every 6 hours as needed for pain control.  Do not exceed 4000 mg of Tylenol in a day.  Your Percocet also has Tylenol in it.

## 2022-08-12 NOTE — ED Provider Notes (Signed)
[facility] Provider Note    Event Date/Time   First MD Initiated Contact with Patient 08/12/22 1222     (approximate)   History   Abdominal Pain   HPI  Kimberly Hatfield is a 87 y.o. female presents to the emergency department with 2 days of abdominal pain.  Endorses left-sided lower abdominal pain for the past 2 days.  Feels like she is bloated, passing gas and having diarrhea.  Denies any blood in her stool.  History of diverticulitis in the past.  Also endorses burning with urination.  Reports a fever at home.  Nausea and episodes of vomiting.  Prior hysterectomy and cholecystectomy.     Physical Exam   Triage Vital Signs: ED Triage Vitals  Enc Vitals Group     BP 08/12/22 1149 123/72     Pulse Rate 08/12/22 1149 90     Resp 08/12/22 1149 18     Temp 08/12/22 1149 97.8 F (36.6 C)     Temp Source 08/12/22 1149 Oral     SpO2 08/12/22 1149 94 %     Weight 08/12/22 1150 172 lb (78 kg)     Height 08/12/22 1150 5' 6"$  (1.676 m)     Head Circumference --      Peak Flow --      Pain Score 08/12/22 1150 10     Pain Loc --      Pain Edu? --      Excl. in [place]? --     Most recent vital signs: Vitals:   08/12/22 1149 08/12/22 1524  BP: 123/72 124/70  Pulse: 90 84  Resp: 18 19  Temp: 97.8 F (36.6 C)   SpO2: 94% 99%    Physical Exam Constitutional:      Appearance: She is well-developed.  HENT:     Head: Atraumatic.  Eyes:     Conjunctiva/sclera: Conjunctivae normal.  Cardiovascular:     Rate and Rhythm: Regular rhythm.  Pulmonary:     Effort: No respiratory distress.  Abdominal:     General: There is no distension.     Tenderness: There is abdominal tenderness in the left lower quadrant.  Musculoskeletal:        General: Normal range of motion.     Cervical back: Normal range of motion.  Skin:    General: Skin is warm.  Neurological:     Mental Status: She is alert. Mental status is at baseline.     IMPRESSION / MDM /  ASSESSMENT AND PLAN / ED COURSE  I reviewed the triage vital signs and the nursing notes.  Differential diagnosis including diverticulitis, intra-abdominal abscess, small bowel obstruction, pyelonephritis, infected kidney stone, pancreatitis, appendicitis  EKG  I, Nathaniel Man, the attending physician, personally viewed and interpreted this ECG.   Rate: Normal  Rhythm: Normal sinus  Axis: Normal  Intervals: Normal  ST&T Change: None  No tachycardic or bradycardic dysrhythmias while on cardiac telemetry.  RADIOLOGY I independently reviewed imaging, my interpretation of imaging: CT abdomen and pelvis with contrast findings concerning for acute diverticulitis with significant amount of inflammation.  CT scan was read as acute diverticulitis with no perforation or abscess.  LABS (all labs ordered are listed, but only abnormal results are displayed) Labs interpreted as -  Significant leukocytosis.  Creatinine at baseline with no significant electrolyte abnormalities.  Labs Reviewed  COMPREHENSIVE METABOLIC PANEL - Abnormal; Notable for the following components:      Result Value   Sodium  134 (*)    Glucose, Bld 135 (*)    Calcium 8.5 (*)    Albumin 3.4 (*)    All other components within normal limits  CBC - Abnormal; Notable for the following components:   WBC 16.3 (*)    All other components within normal limits  LIPASE, BLOOD  URINALYSIS, ROUTINE W REFLEX MICROSCOPIC    TREATMENT  1 L of IV fluids, IV morphine, IV Zofran, IV Rocephin and Flagyl, IV fentanyl  MDM    Reevaluation patient improvement of her pain.  Tolerating p.o.  Will discharge the patient with a prescription for pain medication and antibiotics.  Given strict return precautions to the emergency department for any worsening symptoms and discussed the risk of perforation or abscess.  Discussed risk of opioid medications and constipation, falls and altered mental status.   PROCEDURES:  Critical Care  performed: No  Procedures  Patient's presentation is most consistent with acute presentation with potential threat to life or bodily function.   MEDICATIONS ORDERED IN ED: Medications  cefTRIAXone (ROCEPHIN) 2 g in sodium chloride 0.9 % 100 mL IVPB (2 g Intravenous New Bag/Given 08/12/22 1523)    And  metroNIDAZOLE (FLAGYL) IVPB 500 mg (has no administration in time range)  sodium chloride 0.9 % bolus 1,000 mL (0 mLs Intravenous Stopped 08/12/22 1511)  ondansetron (ZOFRAN) injection 4 mg (4 mg Intravenous Given 08/12/22 1335)  morphine (PF) 2 MG/ML injection 2 mg (2 mg Intravenous Given 08/12/22 1335)  iohexol (OMNIPAQUE) 300 MG/ML solution 100 mL (100 mLs Intravenous Contrast Given 08/12/22 1437)  fentaNYL (SUBLIMAZE) injection 50 mcg (50 mcg Intravenous Given 08/12/22 1523)    FINAL CLINICAL IMPRESSION(S) / ED DIAGNOSES   Final diagnoses:  LLQ abdominal pain  Diverticulitis     Rx / DC Orders   ED Discharge Orders          Ordered    oxyCODONE-acetaminophen (PERCOCET) 5-325 MG tablet  Every 4 hours PRN        08/12/22 1534    ondansetron (ZOFRAN-ODT) 4 MG disintegrating tablet  Every 8 hours PRN        08/12/22 1534    amoxicillin-clavulanate (AUGMENTIN) 875-125 MG tablet  2 times daily        08/12/22 1534             Note:  This document was prepared using Dragon voice recognition software and may include unintentional dictation errors.   Nathaniel Man, MD 08/12/22 1535

## 2022-08-12 NOTE — ED Triage Notes (Signed)
Pt c/o abdominal pain and diarrhea that started yesterday. Feels bloated. Reports when coughing pain worsens. C/o nausea but no emesis

## 2023-02-13 ENCOUNTER — Inpatient Hospital Stay: Payer: Medicare Other | Admitting: Oncology

## 2023-02-13 ENCOUNTER — Inpatient Hospital Stay: Payer: Medicare Other

## 2023-02-27 ENCOUNTER — Other Ambulatory Visit: Payer: Self-pay

## 2023-02-27 DIAGNOSIS — Z853 Personal history of malignant neoplasm of breast: Secondary | ICD-10-CM

## 2023-02-28 ENCOUNTER — Inpatient Hospital Stay (HOSPITAL_BASED_OUTPATIENT_CLINIC_OR_DEPARTMENT_OTHER): Payer: Medicare Other | Admitting: Oncology

## 2023-02-28 ENCOUNTER — Inpatient Hospital Stay: Payer: Medicare Other | Attending: Oncology

## 2023-02-28 ENCOUNTER — Encounter: Payer: Self-pay | Admitting: Oncology

## 2023-02-28 VITALS — BP 112/64 | HR 70 | Temp 97.5°F | Resp 18 | Wt 175.9 lb

## 2023-02-28 DIAGNOSIS — Z853 Personal history of malignant neoplasm of breast: Secondary | ICD-10-CM | POA: Diagnosis present

## 2023-02-28 DIAGNOSIS — Z9012 Acquired absence of left breast and nipple: Secondary | ICD-10-CM | POA: Diagnosis not present

## 2023-02-28 DIAGNOSIS — D649 Anemia, unspecified: Secondary | ICD-10-CM | POA: Diagnosis not present

## 2023-02-28 DIAGNOSIS — Z1231 Encounter for screening mammogram for malignant neoplasm of breast: Secondary | ICD-10-CM | POA: Diagnosis not present

## 2023-02-28 LAB — CBC WITH DIFFERENTIAL (CANCER CENTER ONLY)
Abs Immature Granulocytes: 0.01 10*3/uL (ref 0.00–0.07)
Basophils Absolute: 0 10*3/uL (ref 0.0–0.1)
Basophils Relative: 1 %
Eosinophils Absolute: 0.3 10*3/uL (ref 0.0–0.5)
Eosinophils Relative: 6 %
HCT: 39.8 % (ref 36.0–46.0)
Hemoglobin: 13.1 g/dL (ref 12.0–15.0)
Immature Granulocytes: 0 %
Lymphocytes Relative: 30 %
Lymphs Abs: 1.6 10*3/uL (ref 0.7–4.0)
MCH: 30.9 pg (ref 26.0–34.0)
MCHC: 32.9 g/dL (ref 30.0–36.0)
MCV: 93.9 fL (ref 80.0–100.0)
Monocytes Absolute: 0.4 10*3/uL (ref 0.1–1.0)
Monocytes Relative: 8 %
Neutro Abs: 3 10*3/uL (ref 1.7–7.7)
Neutrophils Relative %: 55 %
Platelet Count: 300 10*3/uL (ref 150–400)
RBC: 4.24 MIL/uL (ref 3.87–5.11)
RDW: 13.2 % (ref 11.5–15.5)
WBC Count: 5.4 10*3/uL (ref 4.0–10.5)
nRBC: 0 % (ref 0.0–0.2)

## 2023-02-28 LAB — CMP (CANCER CENTER ONLY)
ALT: 16 U/L (ref 0–44)
AST: 22 U/L (ref 15–41)
Albumin: 3.7 g/dL (ref 3.5–5.0)
Alkaline Phosphatase: 68 U/L (ref 38–126)
Anion gap: 8 (ref 5–15)
BUN: 19 mg/dL (ref 8–23)
CO2: 25 mmol/L (ref 22–32)
Calcium: 8.8 mg/dL — ABNORMAL LOW (ref 8.9–10.3)
Chloride: 103 mmol/L (ref 98–111)
Creatinine: 0.74 mg/dL (ref 0.44–1.00)
GFR, Estimated: >60 mL/min (ref >60)
Glucose, Bld: 113 mg/dL — ABNORMAL HIGH (ref 70–99)
Potassium: 3.9 mmol/L (ref 3.5–5.1)
Sodium: 136 mmol/L (ref 135–145)
Total Bilirubin: 0.4 mg/dL (ref 0.3–1.2)
Total Protein: 7.4 g/dL (ref 6.5–8.1)

## 2023-02-28 NOTE — Assessment & Plan Note (Addendum)
History of left stage I breast cancer,pT1c pN0, ER/PR positive, HER-2 negative.  Lumpectomy in 2020 January, status post adjuvant radiation. Patient previously declined endocrine therapy due to poor tolerance to 2 different AI and she is not interested in tamoxifen. Clinically she is doing well.  continue annual mammogram.  patient will need bilateral screening mammogram in November 2024-  Recommend patient to continue calcium and vitamin D supplementation.

## 2023-02-28 NOTE — Progress Notes (Signed)
Hematology/Oncology Progress note Telephone:(336) [phone] Fax:([phone]       CHIEF COMPLAINTS/REASON FOR VISIT:  Follow up for breast cancer  ASSESSMENT & PLAN:   History of breast cancer History of left stage I breast cancer,pT1c pN0, ER/PR positive, HER-2 negative.  Lumpectomy in 2020 January, status post adjuvant radiation. Patient previously declined endocrine therapy due to poor tolerance to 2 different AI and she is not interested in tamoxifen. Clinically she is doing well.  continue annual mammogram.  patient will need bilateral screening mammogram in November 2024-  Recommend patient to continue calcium and vitamin D supplementation.  No orders of the defined types were placed in this encounter.   All questions were answered. The patient knows to call the clinic with any problems, questions or concerns.  Rickard Patience, MD, PhD [facility] Hematology Oncology 02/28/2023    HISTORY OF PRESENTING ILLNESS:   Kimberly Hatfield is a  87 y.o.  female with PMH listed below was seen in consultation at the request of  Enid Baas, MD  for evaluation of history of breast cancer.  Patient has a history of left breast cancer.  Her previous cancer care was at [place]. Patient moved to [facility] and wants to establish care with local oncologist. Patient brought past medical records and extensive medical record review was performed by me.  05/08/2018, screening mammogram showed right breast negative, left breast small 6 mm mass 6:00, additional 7 or 8 mm mass at 9:00.  Biopsy showed 5:00 breast mass was positive for invasive mammary carcinoma, ER/PR positive HER-2 negative.  9:00 breast mass biopsy showed intraductal papilloma. 06/27/2018 left partial mastectomy and sentinel lymph node biopsy.  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  Sentinel lymph nodes were negative. Received radiation to the left breast with a dose of 42.56 Gy in 16 fractions followed by boost to the left lumpectomy cavity for an additional 10 Gray in 4 fractions.  Total dose was 52.56 Gy in 20 fractions. 10/10/2018 started on Arimidex. 10/31/2018, Arimidex was held due to severe hot flashes. 11/24/2018, switched to Aromasin.  She stopped Aromasin treatments about 1 month ago.  She reports feeling that both Arimidex and Aromasin have caused severe fatigue, hot flash.  Patient has been on Effexor chronically which did not help her vasomotor symptoms. She is not interested in switching to Tamoxifen  08/18/2020 seen by Dr.Cintron for palpable knot along her surgery site which was felt ot be consistent with scar tissue clinically.   04/26/2021, bilateral screening mammogram showed a possible asymmetry in a separate group of calcification in right breast.  Warrants further evaluation. 05/11/2021 unilateral right diagnostic mammogram and ultrasound showed no persistent breast asymmetry.  Benign right breast calcification.  INTERVAL HISTORY Kimberly Hatfield is a 87 y.o. female who has above history reviewed by me today presents for follow up visit for history of breast cancer. Patient reports feeling well today.  No new complaints.  Review of Systems  Constitutional:  Negative for appetite change, chills, fatigue and fever.  HENT:   Negative for hearing loss and voice change.   Eyes:  Negative for eye problems.  Respiratory:  Negative for chest tightness and cough.   Cardiovascular:  Negative for chest pain.  Gastrointestinal:  Negative for abdominal distention, abdominal pain and blood in stool.  Endocrine: Negative for hot  flashes.  Genitourinary:  Negative for difficulty urinating and frequency.   Musculoskeletal:  Negative for arthralgias.  Skin:  Negative for itching and rash.   Neurological:  Negative for extremity weakness.  Hematological:  Negative for adenopathy.  Psychiatric/Behavioral:  Negative for confusion.     MEDICAL HISTORY:  Past Medical History:  Diagnosis Date   Breast cancer (HCC) 2019   left breast   Depression    Glaucoma    Osteoporosis    Personal history of radiation therapy 2020   left breast ca     SURGICAL HISTORY: Past Surgical History:  Procedure Laterality Date   BREAST BIOPSY Left 06/06/2018   6:00 [facility]   BREAST BIOPSY Left 06/06/2018   9:00 intarductal papilloma   BREAST LUMPECTOMY Left 06/2018   mucinous carcinoma grade 2 clear margins, negative LN   CHOLECYSTECTOMY     left breast lumpectomy      SOCIAL HISTORY: Social History   Socioeconomic History   Marital status: Widowed    Spouse name: Not on file   Number of children: Not on file   Years of education: Not on file   Highest education level: Not on file  Occupational History   Not on file  Tobacco Use   Smoking status: Never   Smokeless tobacco: Never  Vaping Use   Vaping status: Never Used  Substance and Sexual Activity   Alcohol use: Yes    Comment: occasional glass of wine    Drug use: Never   Sexual activity: Not on file  Other Topics Concern   Not on file  Social History Narrative   Not on file   Social Determinants of Health   Financial Resource Strain: Not on file  Food Insecurity: Not on file  Transportation Needs: Not on file  Physical Activity: Not on file  Stress: Not on file  Social Connections: Not on file  Intimate Partner Violence: Not on file    FAMILY HISTORY: Family History  Problem Relation Age of Onset   Heart disease Mother    CAD Mother    Heart disease Father    CAD Father     ALLERGIES:  has No Known Allergies.  MEDICATIONS:  Current Outpatient Medications  Medication Sig Dispense Refill   brimonidine-timolol (COMBIGAN) 0.2-0.5 % ophthalmic solution 1 drop 2 (two) times daily     esomeprazole  (NEXIUM) 20 MG capsule Take by mouth.     Travoprost, BAK Free, (TRAVATAN) 0.004 % SOLN ophthalmic solution      traZODone (DESYREL) 100 MG tablet Take by mouth.     venlafaxine XR (EFFEXOR-XR) 150 MG 24 hr capsule Take by mouth.     ondansetron (ZOFRAN-ODT) 4 MG disintegrating tablet Take 1 tablet (4 mg total) by mouth every 8 (eight) hours as needed for nausea or vomiting. (Patient not taking: Reported on 02/28/2023) 30 tablet 0   No current facility-administered medications for this visit.     PHYSICAL EXAMINATION: ECOG PERFORMANCE STATUS: 0 - Asymptomatic Vitals:   02/28/23 1016  BP: 112/64  Pulse: 70  Resp: 18  Temp: (!) 97.5 F (36.4 C)  SpO2: 99%   Filed Weights   02/28/23 1016  Weight: 175 lb 14.4 oz (79.8 kg)    Physical Exam Constitutional:      General: She is not in acute distress. HENT:     Head: Normocephalic and atraumatic.  Eyes:     General: No scleral icterus. Cardiovascular:     Rate and Rhythm: Normal rate  and regular rhythm.     Heart sounds: Normal heart sounds.  Pulmonary:     Effort: Pulmonary effort is normal. No respiratory distress.     Breath sounds: No wheezing.  Abdominal:     General: Bowel sounds are normal. There is no distension.     Palpations: Abdomen is soft.  Musculoskeletal:        General: No deformity. Normal range of motion.     Cervical back: Normal range of motion and neck supple.  Skin:    General: Skin is warm and dry.     Findings: No erythema or rash.  Neurological:     Mental Status: She is alert and oriented to person, place, and time. Mental status is at baseline.     Cranial Nerves: No cranial nerve deficit.     Coordination: Coordination normal.  Psychiatric:        Mood and Affect: Mood normal.   Patient is status post left lumpectomy with a well-healed surgical scar, minimum tissue thickening.  No palpable mass in the right breast.  No palpable lymphadenopathy bilateral axilla    LABORATORY DATA:  I have  reviewed the data as listed Lab Results  Component Value Date   WBC 5.4 02/28/2023   HGB 13.1 02/28/2023   HCT 39.8 02/28/2023   MCV 93.9 02/28/2023   PLT 300 02/28/2023   Recent Labs    08/12/22 1156 02/28/23 1001  NA 134* 136  K 3.6 3.9  CL 101 103  CO2 25 25  GLUCOSE 135* 113*  BUN 11 19  CREATININE 0.70 0.74  CALCIUM 8.5* 8.8*  GFRNONAA >60 >60  PROT 7.6 7.4  ALBUMIN 3.4* 3.7  AST 32 22  ALT 22 16  ALKPHOS 62 68  BILITOT 1.1 0.4   Iron/TIBC/Ferritin/ %Sat No results found for: "IRON", "TIBC", "FERRITIN", "IRONPCTSAT"    RADIOGRAPHIC STUDIES: I have personally reviewed the radiological images as listed and agreed with the findings in the report. No results found.

## 2023-02-28 NOTE — Addendum Note (Signed)
Addended by: Coralee Rud on: 02/28/2023 01:46 PM   Modules accepted: Orders

## 2023-03-04 ENCOUNTER — Encounter: Payer: Self-pay | Admitting: Internal Medicine

## 2023-03-05 ENCOUNTER — Other Ambulatory Visit: Payer: Self-pay | Admitting: Internal Medicine

## 2023-03-05 DIAGNOSIS — M79602 Pain in left arm: Secondary | ICD-10-CM

## 2023-03-15 ENCOUNTER — Ambulatory Visit
Admission: RE | Admit: 2023-03-15 | Discharge: 2023-03-15 | Disposition: A | Discharge status: Home / Self Care | Payer: Medicare Other | Source: Ambulatory Visit | Attending: Internal Medicine | Admitting: Internal Medicine

## 2023-03-15 DIAGNOSIS — M79602 Pain in left arm: Secondary | ICD-10-CM | POA: Insufficient documentation

## 2023-03-27 ENCOUNTER — Other Ambulatory Visit: Payer: Self-pay

## 2023-03-27 ENCOUNTER — Emergency Department
Admission: EM | Admit: 2023-03-27 | Discharge: 2023-03-27 | Disposition: A | Discharge status: Home / Self Care | Payer: Medicare Other | Attending: Emergency Medicine | Admitting: Emergency Medicine

## 2023-03-27 DIAGNOSIS — Z1152 Encounter for screening for COVID-19: Secondary | ICD-10-CM | POA: Diagnosis not present

## 2023-03-27 DIAGNOSIS — N390 Urinary tract infection, site not specified: Secondary | ICD-10-CM | POA: Insufficient documentation

## 2023-03-27 DIAGNOSIS — K529 Noninfective gastroenteritis and colitis, unspecified: Secondary | ICD-10-CM | POA: Diagnosis not present

## 2023-03-27 DIAGNOSIS — R112 Nausea with vomiting, unspecified: Secondary | ICD-10-CM | POA: Diagnosis present

## 2023-03-27 LAB — COMPREHENSIVE METABOLIC PANEL
ALT: 19 U/L (ref 0–44)
AST: 25 U/L (ref 15–41)
Albumin: 3.9 g/dL (ref 3.5–5.0)
Alkaline Phosphatase: 76 U/L (ref 38–126)
Anion gap: 8 (ref 5–15)
BUN: 23 mg/dL (ref 8–23)
CO2: 24 mmol/L (ref 22–32)
Calcium: 9.3 mg/dL (ref 8.9–10.3)
Chloride: 104 mmol/L (ref 98–111)
Creatinine, Ser: 1.12 mg/dL — ABNORMAL HIGH (ref 0.44–1.00)
GFR, Estimated: 48 mL/min — ABNORMAL LOW (ref >60)
Glucose, Bld: 127 mg/dL — ABNORMAL HIGH (ref 70–99)
Potassium: 4.2 mmol/L (ref 3.5–5.1)
Sodium: 136 mmol/L (ref 135–145)
Total Bilirubin: 0.4 mg/dL (ref 0.3–1.2)
Total Protein: 8.3 g/dL — ABNORMAL HIGH (ref 6.5–8.1)

## 2023-03-27 LAB — CBC WITH DIFFERENTIAL/PLATELET
Abs Immature Granulocytes: 0.03 10*3/uL (ref 0.00–0.07)
Basophils Absolute: 0 10*3/uL (ref 0.0–0.1)
Basophils Relative: 0 %
Eosinophils Absolute: 0.1 10*3/uL (ref 0.0–0.5)
Eosinophils Relative: 1 %
HCT: 47.5 % — ABNORMAL HIGH (ref 36.0–46.0)
Hemoglobin: 15 g/dL (ref 12.0–15.0)
Immature Granulocytes: 0 %
Lymphocytes Relative: 7 %
Lymphs Abs: 0.6 10*3/uL — ABNORMAL LOW (ref 0.7–4.0)
MCH: 30.5 pg (ref 26.0–34.0)
MCHC: 31.6 g/dL (ref 30.0–36.0)
MCV: 96.7 fL (ref 80.0–100.0)
Monocytes Absolute: 0.3 10*3/uL (ref 0.1–1.0)
Monocytes Relative: 4 %
Neutro Abs: 7 10*3/uL (ref 1.7–7.7)
Neutrophils Relative %: 88 %
Platelets: 309 10*3/uL (ref 150–400)
RBC: 4.91 MIL/uL (ref 3.87–5.11)
RDW: 13.3 % (ref 11.5–15.5)
WBC: 7.9 10*3/uL (ref 4.0–10.5)
nRBC: 0 % (ref 0.0–0.2)

## 2023-03-27 LAB — URINALYSIS, W/ REFLEX TO CULTURE (INFECTION SUSPECTED)
Bilirubin Urine: NEGATIVE
Glucose, UA: NEGATIVE mg/dL
Ketones, ur: NEGATIVE mg/dL
Nitrite: NEGATIVE
Protein, ur: NEGATIVE mg/dL
Specific Gravity, Urine: 1.01 (ref 1.005–1.030)
pH: 5 (ref 5.0–8.0)

## 2023-03-27 LAB — LIPASE, BLOOD: Lipase: 36 U/L (ref 11–51)

## 2023-03-27 LAB — SARS CORONAVIRUS 2 BY RT PCR: SARS Coronavirus 2 by RT PCR: NEGATIVE

## 2023-03-27 MED ORDER — ONDANSETRON HCL 4 MG/2ML IJ SOLN
4.0000 mg | Freq: Once | INTRAMUSCULAR | Status: AC
  Administered 2023-03-27: 4 mg via INTRAVENOUS
  Filled 2023-03-27: qty 2

## 2023-03-27 MED ORDER — LOPERAMIDE HCL 2 MG PO CAPS
4.0000 mg | ORAL_CAPSULE | Freq: Once | ORAL | Status: AC
  Administered 2023-03-27: 4 mg via ORAL
  Filled 2023-03-27: qty 2

## 2023-03-27 MED ORDER — ONDANSETRON 4 MG PO TBDP: 4.0000 mg | ORAL_TABLET | Freq: Three times a day (TID) | ORAL | 0 refills | Status: AC | PRN

## 2023-03-27 MED ORDER — CEPHALEXIN 500 MG PO CAPS: 500.0000 mg | ORAL_CAPSULE | Freq: Two times a day (BID) | ORAL | 0 refills | Status: AC

## 2023-03-27 MED ORDER — SODIUM CHLORIDE 0.9 % IV BOLUS
1000.0000 mL | Freq: Once | INTRAVENOUS | Status: AC
  Administered 2023-03-27: 1000 mL via INTRAVENOUS

## 2023-03-27 NOTE — ED Notes (Signed)
Pt also ate crackers without issue

## 2023-03-27 NOTE — ED Provider Notes (Signed)
[facility] Provider Note    Event Date/Time   First MD Initiated Contact with Patient 03/27/23 [phone]     (approximate)  History   Chief Complaint: Emesis  HPI  Ranee Peasley is a 87 y.o. female with a past medical history of depression presents to the emergency department for nausea vomiting and diarrhea.  According to the patient around 3:00 this morning she developed acute onset of nausea vomiting and diarrhea.  Patient states this happens approximately every 6 months or she will have nausea vomiting and diarrhea that seems to occur for no reason.  States often times she is able to power through at home but it did not seem to be abating so the patient came to the emergency department for evaluation.  Patient denies any abdominal pain.  No fever.  No recent cough congestion.  Denies any black or bloody stool.  Physical Exam   Triage Vital Signs: ED Triage Vitals  Encounter Vitals Group     BP --      Systolic BP Percentile --      Diastolic BP Percentile --      Pulse --      Resp --      Temp --      Temp src --      SpO2 --      Weight 03/27/23 0523 168 lb (76.2 kg)     Height 03/27/23 0523 5\' 5"  (1.651 m)     Head Circumference --      Peak Flow --      Pain Score 03/27/23 0511 0     Pain Loc --      Pain Education --      Exclude from Growth Chart --     Most recent vital signs: There were no vitals filed for this visit.  General: Awake, no distress.  CV:  Good peripheral perfusion.  Regular rate and rhythm  Resp:  Normal effort.  Equal breath sounds bilaterally.  Abd:  No distention.  Soft, nontender.  No rebound or guarding.  Benign abdomen.  ED Results / Procedures / Treatments   MEDICATIONS ORDERED IN ED: Medications  ondansetron (ZOFRAN) injection 4 mg (has no administration in time range)  sodium chloride 0.9 % bolus 1,000 mL (has no administration in time range)  loperamide (IMODIUM) capsule 4 mg (has no administration in  time range)     IMPRESSION / MDM / ASSESSMENT AND PLAN / ED COURSE  I reviewed the triage vital signs and the nursing notes.  Patient's presentation is most consistent with acute presentation with potential threat to life or bodily function.  Patient presents to the emergency department for nausea vomiting and diarrhea that started around 3 AM this morning.  Patient continues states she feels nauseated.  She denies any abdominal pain now or at any point.  Denies any black or bloody stool.  No fever.  Patient has a benign abdomen on my exam.  We will treat nausea, IV hydrate, treat diarrhea and continue to closely monitor.  Will check labs including a CBC chemistry and lipase as well as urinalysis.  Patient agreeable to plan of care and workup.  Patient's lab work is reassuring CBC shows normal white blood cell count, chemistry shows no concerning findings and lipase and LFTs are normal.  Urinalysis pending.  Patient receiving fluids and medications.  We will reassess.  FINAL CLINICAL IMPRESSION(S) / ED DIAGNOSES   Gastroenteritis    Note:  This document was prepared using Dragon voice recognition software and may include unintentional dictation errors.   Minna Antis, MD 03/27/23 [phone]

## 2023-03-27 NOTE — Discharge Instructions (Addendum)
Please drink plenty of fluids, use over-the-counter loperamide (Imodium) as written on the box as needed for diarrhea.  You may use your prescribed nausea medication as needed, as written.  Take the antibiotic as prescribed for the urinary tract infection.  Return to the emergency department for any worsening symptoms if you are unable to tolerate fluids develop abdominal pain, or fever, or any other symptom personally concerning to yourself.  Otherwise please follow-up with your doctor in 2 to 3 days for recheck.

## 2023-03-27 NOTE — ED Provider Notes (Signed)
-----------------------------------------   9:55 AM on 03/27/2023 -----------------------------------------  I took over care of this patient from Dr. Lenard Lance.  COVID is negative.  Urinalysis does show some WBCs and bacteria and the patient endorses dysuria.  We will treat empirically for UTI with Keflex.  The patient states she is feeling much better.  She is no longer vomiting.  She was able to tolerate p.o. liquids and solids.  She is stable for discharge at this time.  Return precautions given, and she expresses understanding.   Dionne Bucy, MD 03/27/23 [phone]

## 2023-03-27 NOTE — ED Triage Notes (Signed)
Pt arrives via [facility] with CC of sudden onset of vomiting and diarrhea with abd pain at approx 0330.

## 2023-03-28 LAB — URINE CULTURE: Culture: NO GROWTH

## 2023-04-29 ENCOUNTER — Ambulatory Visit
Admission: RE | Admit: 2023-04-29 | Discharge: 2023-04-29 | Disposition: A | Discharge status: Home / Self Care | Payer: Medicare Other | Source: Ambulatory Visit | Attending: Oncology | Admitting: Oncology

## 2023-04-29 DIAGNOSIS — Z853 Personal history of malignant neoplasm of breast: Secondary | ICD-10-CM | POA: Insufficient documentation

## 2023-04-29 DIAGNOSIS — Z1231 Encounter for screening mammogram for malignant neoplasm of breast: Secondary | ICD-10-CM | POA: Diagnosis present

## 2024-02-28 ENCOUNTER — Inpatient Hospital Stay: Payer: Medicare Other | Admitting: Oncology

## 2024-03-02 ENCOUNTER — Encounter: Payer: Self-pay | Admitting: Oncology

## 2024-03-02 ENCOUNTER — Inpatient Hospital Stay: Attending: Oncology | Admitting: Oncology

## 2024-03-02 VITALS — BP 125/69 | HR 63 | Temp 97.6°F | Resp 16 | Wt 177.9 lb

## 2024-03-02 DIAGNOSIS — Z17411 Hormone receptor positive with human epidermal growth factor receptor 2 negative status: Secondary | ICD-10-CM | POA: Insufficient documentation

## 2024-03-02 DIAGNOSIS — Z1231 Encounter for screening mammogram for malignant neoplasm of breast: Secondary | ICD-10-CM | POA: Diagnosis not present

## 2024-03-02 DIAGNOSIS — C50812 Malignant neoplasm of overlapping sites of left female breast: Secondary | ICD-10-CM | POA: Diagnosis present

## 2024-03-02 DIAGNOSIS — Z853 Personal history of malignant neoplasm of breast: Secondary | ICD-10-CM

## 2024-03-02 DIAGNOSIS — M810 Age-related osteoporosis without current pathological fracture: Secondary | ICD-10-CM | POA: Diagnosis not present

## 2024-03-02 NOTE — Assessment & Plan Note (Addendum)
 History of left stage I breast cancer,pT1c pN0, ER/PR positive, HER-2 negative.  Lumpectomy in 2020 January, status post adjuvant radiation. Patient previously declined endocrine therapy due to poor tolerance to 2 different AI and she is not interested in tamoxifen. Clinically she is doing well.  continue annual mammogram.  patient will need bilateral screening mammogram in November 2025  she is + 5 years after surgery. If this mammogram is negative, patient elects to be discharged and follow up with PCP.  Recommend patient to continue calcium and vitamin D supplementation.

## 2024-03-02 NOTE — Progress Notes (Signed)
 Hematology/Oncology Progress note Telephone:(336) [phone] Fax:(336) [phone]       CHIEF COMPLAINTS/REASON FOR VISIT:  Follow up for breast cancer  ASSESSMENT & PLAN:   History of breast cancer History of left stage I breast cancer,pT1c pN0, ER/PR positive, HER-2 negative.  Lumpectomy in 2020 January, status post adjuvant radiation. Patient previously declined endocrine therapy due to poor tolerance to 2 different AI and she is not interested in tamoxifen. Clinically she is doing well.  continue annual mammogram.  patient will need bilateral screening mammogram in November 2025  she is + 5 years after surgery. If this mammogram is negative, patient elects to be discharged and follow up with PCP.  Recommend patient to continue calcium and vitamin D supplementation.  Orders Placed This Encounter  Procedures   MM 3D SCREENING MAMMOGRAM BILATERAL BREAST    Standing Status:   Future    Expected Date:   05/05/2024    Expiration Date:   03/02/2025    Reason for Exam (SYMPTOM  OR DIAGNOSIS REQUIRED):   screening mammogram    Preferred imaging location?:   [facility]   We spent sufficient time to discuss many aspect of care, questions were answered to patient's satisfaction. A total of 25 minutes was spent on this visit.  With 5 minutes spent reviewing labs, 15 minutes counseling the patient on the breast examination, surveillance plan.  Additional 5 minutes was spent on answering patient's questions.  All questions were answered. The patient knows to call the clinic with any problems, questions or concerns.  Zelphia Cap, MD, PhD [facility] Hematology Oncology 03/02/2024    HISTORY OF PRESENTING ILLNESS:   Kimberly Hatfield is a  88 y.o.  female with PMH listed below was seen in consultation at the request of  Sherial Bail, MD  for evaluation of history of breast cancer.  Patient has a history of left breast cancer.  Her previous cancer care was at [place]. Patient  moved to [facility] and wants to establish care with local oncologist. Patient brought past medical records and extensive medical record review was performed by me.  05/08/2018, screening mammogram showed right breast negative, left breast small 6 mm mass 6:00, additional 7 or 8 mm mass at 9:00.  Biopsy showed 5:00 breast mass was positive for invasive mammary carcinoma, ER/PR positive HER-2 negative.  9:00 breast mass biopsy showed intraductal papilloma. 06/27/2018 left partial mastectomy and sentinel lymph node biopsy.  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  Sentinel lymph nodes were negative. Received radiation to the left breast with a dose of 42.56 Gy in 16 fractions followed by boost to the left lumpectomy cavity for an additional 10 Gray in 4 fractions.  Total dose was 52.56 Gy in 20 fractions. 10/10/2018 started on Arimidex. 10/31/2018, Arimidex was held due to severe hot flashes. 11/24/2018, switched to Aromasin.  She stopped Aromasin treatments about 1 month ago.  She reports feeling that both Arimidex and Aromasin have caused severe fatigue, hot flash.  Patient has been on Effexor chronically which did not help her vasomotor symptoms. She is not interested in switching to Tamoxifen  08/18/2020 seen by Dr.Cintron for palpable knot along her surgery site which was felt ot be consistent with scar tissue clinically.   04/26/2021, bilateral screening mammogram showed a possible asymmetry in a separate group of calcification  in right breast.  Warrants further evaluation. 05/11/2021 unilateral right diagnostic mammogram and ultrasound showed no persistent breast asymmetry.  Benign right breast calcification.  INTERVAL HISTORY Kimberly Hatfield is a 88 y.o. female who has above history  reviewed by me today presents for follow up visit for history of breast cancer. Patient reports feeling well today.  No new complaints. She has no new breast concerns.   Review of Systems  Constitutional:  Negative for appetite change, chills, fatigue and fever.  HENT:   Negative for hearing loss and voice change.   Eyes:  Negative for eye problems.  Respiratory:  Negative for chest tightness and cough.   Cardiovascular:  Negative for chest pain.  Gastrointestinal:  Negative for abdominal distention, abdominal pain and blood in stool.  Endocrine: Negative for hot flashes.  Genitourinary:  Negative for difficulty urinating and frequency.   Musculoskeletal:  Negative for arthralgias.  Skin:  Negative for itching and rash.  Neurological:  Negative for extremity weakness.  Hematological:  Negative for adenopathy.  Psychiatric/Behavioral:  Negative for confusion.     MEDICAL HISTORY:  Past Medical History:  Diagnosis Date   Breast cancer (HCC) 2019   left breast   Depression    Glaucoma    Osteoporosis    Personal history of radiation therapy 2020   left breast ca     SURGICAL HISTORY: Past Surgical History:  Procedure Laterality Date   BREAST BIOPSY Left 06/06/2018   6:00 [facility]   BREAST BIOPSY Left 06/06/2018   9:00 intarductal papilloma   BREAST LUMPECTOMY Left 06/2018   mucinous carcinoma grade 2 clear margins, negative LN   CHOLECYSTECTOMY     left breast lumpectomy      SOCIAL HISTORY: Social History   Socioeconomic History   Marital status: Widowed    Spouse name: Not on file   Number of children: Not on file   Years of education: Not on file   Highest education level: Not on file  Occupational History   Not on file  Tobacco Use   Smoking status: Never   Smokeless tobacco: Never  Vaping Use   Vaping status: Never Used  Substance and Sexual Activity   Alcohol use: Yes    Comment: occasional glass of wine    Drug use: Never   Sexual activity: Not on file   Other Topics Concern   Not on file  Social History Narrative   Not on file   Social Drivers of Health   Financial Resource Strain: Low Risk  (08/29/2023)   Received from [facility]   Overall Financial Resource Strain (CARDIA)    Difficulty of Paying Living Expenses: Not hard at all  Food Insecurity: No Food Insecurity (08/29/2023)   Received from [facility]   Hunger Vital Sign    Within the past 12 months, you worried that your food would run out before you got the money to buy more.: Never true    Within the past 12 months, the food you bought just didn't last and you didn't have money to get more.: Never true  Transportation Needs: No Transportation Needs (08/29/2023)   Received from [facility] - Transportation    In the past 12 months, has lack of transportation kept you from medical appointments or from getting medications?: No    Lack of Transportation (Non-Medical): No  Physical Activity: Not on file  Stress: Not on file  Social Connections: Not on file  Intimate Partner Violence: Not on file    FAMILY HISTORY: Family History  Problem Relation Age of Onset   Heart disease Mother    CAD Mother    Heart disease Father    CAD Father    Breast cancer Maternal Aunt    Breast cancer Cousin     ALLERGIES:  has no known allergies.  MEDICATIONS:  Current Outpatient Medications  Medication Sig Dispense Refill   brimonidine-timolol (COMBIGAN) 0.2-0.5 % ophthalmic solution 1 drop 2 (two) times daily     esomeprazole (NEXIUM) 20 MG capsule Take by mouth.     Travoprost, BAK Free, (TRAVATAN) 0.004 % SOLN ophthalmic solution      traZODone (DESYREL) 100 MG tablet Take by mouth.     venlafaxine XR (EFFEXOR-XR) 150 MG 24 hr capsule Take by mouth.     ondansetron  (ZOFRAN -ODT) 4 MG disintegrating tablet Take 1 tablet (4 mg total) by mouth every 8 (eight) hours as needed. 12 tablet 0   No current facility-administered  medications for this visit.     PHYSICAL EXAMINATION: ECOG PERFORMANCE STATUS: 0 - Asymptomatic Vitals:   03/02/24 1005  BP: 125/69  Pulse: 63  Resp: 16  Temp: 97.6 F (36.4 C)  SpO2: 94%   Filed Weights   03/02/24 1005  Weight: 177 lb 14.4 oz (80.7 kg)    Physical Exam Constitutional:      General: She is not in acute distress. HENT:     Head: Normocephalic and atraumatic.  Eyes:     General: No scleral icterus. Cardiovascular:     Rate and Rhythm: Normal rate and regular rhythm.     Heart sounds: Normal heart sounds.  Pulmonary:     Effort: Pulmonary effort is normal. No respiratory distress.     Breath sounds: No wheezing.  Abdominal:     General: Bowel sounds are normal. There is no distension.     Palpations: Abdomen is soft.  Musculoskeletal:        General: No deformity. Normal range of motion.     Cervical back: Normal range of motion and neck supple.  Skin:    General: Skin is warm and dry.     Findings: No erythema or rash.  Neurological:     Mental Status: She is alert and oriented to person, place, and time. Mental status is at baseline.     Cranial Nerves: No cranial nerve deficit.     Coordination: Coordination normal.  Psychiatric:        Mood and Affect: Mood normal.   Patient is status post left lumpectomy with a well-healed surgical scar, minimum tissue thickening.  No palpable mass in the right breast.  No palpable lymphadenopathy bilateral axilla    LABORATORY DATA:  I have reviewed the data as listed Lab Results  Component Value Date   WBC 7.9 03/27/2023   HGB 15.0 03/27/2023   HCT 47.5 (H) 03/27/2023   MCV 96.7 03/27/2023   PLT 309 03/27/2023   Recent Labs    03/27/23 0525  NA 136  K 4.2  CL 104  CO2 24  GLUCOSE 127*  BUN 23  CREATININE 1.12*  CALCIUM 9.3  GFRNONAA 48*  PROT 8.3*  ALBUMIN 3.9  AST 25  ALT 19  ALKPHOS 76  BILITOT 0.4   Iron/TIBC/Ferritin/ %Sat No results found for: IRON, TIBC, FERRITIN,  IRONPCTSAT    RADIOGRAPHIC STUDIES: I have personally reviewed the radiological images as listed and agreed with the findings in the report.  No results found.

## 2024-03-02 NOTE — Progress Notes (Signed)
 Pt in for yearly follow up, reports due for mammogram in Nov.  Denies any concerns.

## 2024-04-30 ENCOUNTER — Ambulatory Visit
Admission: RE | Admit: 2024-04-30 | Discharge: 2024-04-30 | Disposition: A | Discharge status: Home / Self Care | Source: Ambulatory Visit | Attending: Oncology | Admitting: Oncology

## 2024-04-30 DIAGNOSIS — Z853 Personal history of malignant neoplasm of breast: Secondary | ICD-10-CM | POA: Insufficient documentation

## 2024-04-30 DIAGNOSIS — Z1231 Encounter for screening mammogram for malignant neoplasm of breast: Secondary | ICD-10-CM | POA: Diagnosis present
